# Patient Record
Sex: Male | Born: 1965 | Race: Black or African American | Hispanic: No | Marital: Single | Smoking: Never smoker
Health system: Southern US, Community
[De-identification: ages and names within clinical notes are randomized; demographics above are authoritative.]

## PROBLEM LIST (undated history)

## (undated) DIAGNOSIS — M1990 Unspecified osteoarthritis, unspecified site: Secondary | ICD-10-CM

## (undated) DIAGNOSIS — E785 Hyperlipidemia, unspecified: Secondary | ICD-10-CM

## (undated) DIAGNOSIS — W3400XA Accidental discharge from unspecified firearms or gun, initial encounter: Secondary | ICD-10-CM

## (undated) DIAGNOSIS — I10 Essential (primary) hypertension: Secondary | ICD-10-CM

## (undated) HISTORY — DX: Accidental discharge from unspecified firearms or gun, initial encounter: W34.00XA

## (undated) HISTORY — DX: Unspecified osteoarthritis, unspecified site: M19.90

## (undated) HISTORY — DX: Hyperlipidemia, unspecified: E78.5

---

## 1998-03-31 ENCOUNTER — Emergency Department (HOSPITAL_COMMUNITY): Admission: EM | Admit: 1998-03-31 | Discharge: 1998-03-31 | Payer: Self-pay | Admitting: Emergency Medicine

## 2001-04-16 ENCOUNTER — Emergency Department (HOSPITAL_COMMUNITY): Admission: EM | Admit: 2001-04-16 | Discharge: 2001-04-16 | Payer: Self-pay | Admitting: Emergency Medicine

## 2002-02-14 ENCOUNTER — Emergency Department (HOSPITAL_COMMUNITY): Admission: AC | Admit: 2002-02-14 | Discharge: 2002-02-14 | Payer: Self-pay

## 2002-02-14 ENCOUNTER — Encounter: Payer: Self-pay | Admitting: Emergency Medicine

## 2004-05-07 ENCOUNTER — Emergency Department: Payer: Self-pay | Admitting: Unknown Physician Specialty

## 2006-09-23 ENCOUNTER — Emergency Department (HOSPITAL_COMMUNITY): Admission: EM | Admit: 2006-09-23 | Discharge: 2006-09-23 | Payer: Self-pay | Admitting: *Deleted

## 2006-10-15 ENCOUNTER — Ambulatory Visit: Payer: Self-pay | Admitting: Gastroenterology

## 2007-04-23 ENCOUNTER — Emergency Department (HOSPITAL_COMMUNITY): Admission: EM | Admit: 2007-04-23 | Discharge: 2007-04-24 | Payer: Self-pay | Admitting: Emergency Medicine

## 2010-07-31 DIAGNOSIS — W3400XA Accidental discharge from unspecified firearms or gun, initial encounter: Secondary | ICD-10-CM

## 2010-07-31 HISTORY — DX: Accidental discharge from unspecified firearms or gun, initial encounter: W34.00XA

## 2010-07-31 HISTORY — PX: KNEE SURGERY: SHX244

## 2010-08-07 ENCOUNTER — Inpatient Hospital Stay (HOSPITAL_COMMUNITY)
Admission: EM | Admit: 2010-08-07 | Discharge: 2010-08-17 | Payer: Self-pay | Source: Home / Self Care | Attending: Orthopedic Surgery | Admitting: Orthopedic Surgery

## 2010-08-15 LAB — CBC
HCT: 38.8 % — ABNORMAL LOW (ref 39.0–52.0)
HCT: 36.5 % — ABNORMAL LOW (ref 39.0–52.0)
Hemoglobin: 13.3 g/dL (ref 13.0–17.0)
Hemoglobin: 12 g/dL — ABNORMAL LOW (ref 13.0–17.0)
MCH: 32.6 pg (ref 26.0–34.0)
MCH: 32.2 pg (ref 26.0–34.0)
MCHC: 34.3 g/dL (ref 30.0–36.0)
MCHC: 32.9 g/dL (ref 30.0–36.0)
MCV: 95.1 fL (ref 78.0–100.0)
MCV: 97.9 fL (ref 78.0–100.0)
Platelets: 341 10*3/uL (ref 150–400)
Platelets: 466 10*3/uL — ABNORMAL HIGH (ref 150–400)
RBC: 4.08 MIL/uL — ABNORMAL LOW (ref 4.22–5.81)
RBC: 3.73 MIL/uL — ABNORMAL LOW (ref 4.22–5.81)
RDW: 12.6 % (ref 11.5–15.5)
RDW: 12.6 % (ref 11.5–15.5)
WBC: 10.2 10*3/uL (ref 4.0–10.5)
WBC: 10.1 10*3/uL (ref 4.0–10.5)

## 2010-08-15 LAB — PROTIME-INR
INR: 0.98 (ref 0.00–1.49)
INR: 1.15 (ref 0.00–1.49)
INR: 1.27 (ref 0.00–1.49)
INR: 1.66 — ABNORMAL HIGH (ref 0.00–1.49)
INR: 1.87 — ABNORMAL HIGH (ref 0.00–1.49)
INR: 1.93 — ABNORMAL HIGH (ref 0.00–1.49)
INR: 2.23 — ABNORMAL HIGH (ref 0.00–1.49)
Prothrombin Time: 13.2 seconds (ref 11.6–15.2)
Prothrombin Time: 14.9 seconds (ref 11.6–15.2)
Prothrombin Time: 16.1 seconds — ABNORMAL HIGH (ref 11.6–15.2)
Prothrombin Time: 19.8 seconds — ABNORMAL HIGH (ref 11.6–15.2)
Prothrombin Time: 21.7 seconds — ABNORMAL HIGH (ref 11.6–15.2)
Prothrombin Time: 22.2 seconds — ABNORMAL HIGH (ref 11.6–15.2)
Prothrombin Time: 24.8 seconds — ABNORMAL HIGH (ref 11.6–15.2)

## 2010-08-15 LAB — COMPREHENSIVE METABOLIC PANEL
ALT: 35 U/L (ref 0–53)
AST: 44 U/L — ABNORMAL HIGH (ref 0–37)
Albumin: 3.6 g/dL (ref 3.5–5.2)
Alkaline Phosphatase: 55 U/L (ref 39–117)
BUN: 6 mg/dL (ref 6–23)
CO2: 24 mEq/L (ref 19–32)
Calcium: 8.6 mg/dL (ref 8.4–10.5)
Chloride: 106 mEq/L (ref 96–112)
Creatinine, Ser: 1.06 mg/dL (ref 0.4–1.5)
GFR calc Af Amer: >60 mL/min (ref >60)
GFR calc non Af Amer: >60 mL/min (ref >60)
Glucose, Bld: 94 mg/dL (ref 70–99)
Potassium: 4 mEq/L (ref 3.5–5.1)
Sodium: 138 mEq/L (ref 135–145)
Total Bilirubin: 0.9 mg/dL (ref 0.3–1.2)
Total Protein: 6.8 g/dL (ref 6.0–8.3)

## 2010-08-15 LAB — DIFFERENTIAL
Basophils Absolute: 0 10*3/uL (ref 0.0–0.1)
Basophils Relative: 0 % (ref 0–1)
Eosinophils Absolute: 0 10*3/uL (ref 0.0–0.7)
Eosinophils Relative: 0 % (ref 0–5)
Lymphocytes Relative: 18 % (ref 12–46)
Lymphs Abs: 1.9 10*3/uL (ref 0.7–4.0)
Monocytes Absolute: 1.1 10*3/uL — ABNORMAL HIGH (ref 0.1–1.0)
Monocytes Relative: 11 % (ref 3–12)
Neutro Abs: 7.2 10*3/uL (ref 1.7–7.7)
Neutrophils Relative %: 71 % (ref 43–77)

## 2010-08-17 LAB — PROTIME-INR
INR: 2.33 — ABNORMAL HIGH (ref 0.00–1.49)
INR: 2.44 — ABNORMAL HIGH (ref 0.00–1.49)
Prothrombin Time: 25.7 seconds — ABNORMAL HIGH (ref 11.6–15.2)
Prothrombin Time: 26.6 seconds — ABNORMAL HIGH (ref 11.6–15.2)

## 2010-08-22 LAB — PROTIME-INR
INR: 2.31 — ABNORMAL HIGH (ref 0.00–1.49)
Prothrombin Time: 25.5 seconds — ABNORMAL HIGH (ref 11.6–15.2)

## 2010-12-16 NOTE — Assessment & Plan Note (Signed)
[facility]                         GASTROENTEROLOGY OFFICE NOTE   TADARIUS, MALAND                       MRN:          [id number]  DATE:10/16/2006                            DOB:          April 13, 1966    REFERRING PHYSICIAN:  Doug Sou, M.D.   REASON FOR REFERRAL:  Rectal bleeding and rectal pain.   HISTORY OF PRESENT ILLNESS:  Gabriel Mccann is a 45 year old male who  relates a history of rectal pain and rectal bleeding that occurred about  1 year ago.  He states he was diagnosed with an anal fissure in [facility] emergency room.  He was prescribed a topical cream and his  symptoms completely resolved, until about 3 months ago when he had  problems with recurrent rectal bleeding, sometimes on the tissue paper  and sometimes in the stool, associated with painful bowel movements.  He  was seen in [facility] emergency room on September 23, 2006 and  the findings of that evaluation are enclosed.  Rectal examination showed  a tender anal canal, external hemorrhoids, and normal-colored Hemoccult-  positive stool.  A basic metabolic panel and CBC were normal.  He was  prescribed Colace and Vicodin.  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  He has had less bleeding over the  past few weeks, but still has pain with almost every bowel movement, and  then the pain abates.  He notes he has had improvement using a stool  softener.  He notes no change in the stool caliber, weight loss,  abdominal pain, nausea, vomiting, or diarrhea.  There is no family  history of colon cancer, colon polyps, or inflammatory bowel disease.   PAST MEDICAL HISTORY:  As in the history of present illness.   PAST SURGICAL HISTORY:  Negative.   CURRENT MEDICATIONS:  1. Vicodin 5/500 q.6h p.r.n.  2. Colace 100 mg daily.   MEDICATION ALLERGIES:  None known.   SOCIAL HISTORY AND REVIEW OF SYSTEMS:  Per the hand-written  form.   PHYSICAL EXAM:  Well-developed, well-nourished in no acute distress.  Height 5 feet 7 inches, weight 169 pounds, blood pressure 122/70, pulse  92 and regular.  HEENT:  Anicteric sclerae.  Oropharynx clear.  CHEST:  Clear to auscultation bilaterally.  CARDIAC:  Regular rate and rhythm without murmurs.  ABDOMEN:  Soft and nontender.  Nondistended.  Normoactive bowel sounds.  No palpable organomegaly, masses, or hernias.  RECTAL:  Examination reveals small external hemorrhoidal tags.  No other  lesions.  No fissures noted.  Digital examination reveals a very tender  anal canal, which limited the examination.  I was not able to obtain a  stool specimen for Hemoccult testing.  EXTREMITIES:  Without cyanosis, clubbing, or edema.  NEUROLOGIC:  Alert and oriented x3.  Grossly nonfocal.   ASSESSMENT AND PLAN:  Anal pain with rectal bleeding, hemocult positive  stool and small external hemorrhoids.  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  Begin diltiazem 2% cream b.i.d. for  the next 6 to 8 weeks.  Continue Colace on a daily basis and maintain  adequate hydration with a high-fiber diet.  Plan for return office visit  in 6 to 8 weeks for further evaluation to include a colonoscopy to rule  out inflammatory bowel disease, colorectal neoplasms, and other  disorders.     Venita Lick. Russella Dar, MD, [facility]  Electronically Signed    MTS/MedQ  DD: 10/15/2006  DT: 10/16/2006  Job #: [id number]   cc:   Doug Sou, M.D.

## 2011-05-11 LAB — DIFFERENTIAL
Basophils Absolute: 0
Basophils Relative: 1
Eosinophils Absolute: 0.1
Eosinophils Relative: 1
Lymphocytes Relative: 23
Lymphs Abs: 1.6
Monocytes Absolute: 0.6
Monocytes Relative: 9
Neutro Abs: 4.4
Neutrophils Relative %: 66

## 2011-05-11 LAB — CBC
HCT: 37.9 — ABNORMAL LOW
Hemoglobin: 13.3
MCHC: 35
MCV: 94.6
Platelets: 324
RBC: 4.01 — ABNORMAL LOW
RDW: 12.5
WBC: 6.7

## 2011-05-11 LAB — COMPREHENSIVE METABOLIC PANEL
ALT: 21
AST: 40 — ABNORMAL HIGH
Albumin: 3.6
Alkaline Phosphatase: 49
BUN: 7
CO2: 28
Calcium: 9
Chloride: 105
Creatinine, Ser: 0.93
GFR calc Af Amer: >60
GFR calc non Af Amer: >60
Glucose, Bld: 103 — ABNORMAL HIGH
Potassium: 3.8
Sodium: 137
Total Bilirubin: 1.2
Total Protein: 6.2

## 2011-05-11 LAB — LIPASE, BLOOD: Lipase: 37

## 2011-05-11 LAB — OCCULT BLOOD X 1 CARD TO LAB, STOOL: Fecal Occult Bld: NEGATIVE

## 2014-10-02 ENCOUNTER — Encounter: Payer: Self-pay | Admitting: Family Medicine

## 2014-10-02 ENCOUNTER — Ambulatory Visit: Payer: Medicaid Other | Attending: Family Medicine | Admitting: Family Medicine

## 2014-10-02 VITALS — BP 115/78 | HR 90 | Temp 98.6°F | Resp 16 | Ht 67.0 in | Wt 171.0 lb

## 2014-10-02 DIAGNOSIS — G8929 Other chronic pain: Secondary | ICD-10-CM | POA: Diagnosis not present

## 2014-10-02 DIAGNOSIS — M25562 Pain in left knee: Secondary | ICD-10-CM | POA: Diagnosis not present

## 2014-10-02 DIAGNOSIS — M25561 Pain in right knee: Secondary | ICD-10-CM | POA: Diagnosis not present
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
## 2014-10-02 NOTE — Assessment & Plan Note (Signed)
1. Chronic bilateral knee pain: Referral to pain management placed You will be contacted with appt details.

## 2014-10-02 NOTE — Patient Instructions (Signed)
Gabriel Mccann,  Thank you for coming in today. It was a pleasure meeting you. I look forward to being your primary doctor.   1. Chronic bilateral knee pain: Referral to pain management placed You will be contacted with appt details.   Please apply for [place] discount and orange card, you can also inquire if any of your medications are on the PASS (medications assistance) list.   F/u in 6-8 weeks for full wellness physical  Dr. Adrian Blackwater
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
## 2016-08-04 ENCOUNTER — Encounter (HOSPITAL_COMMUNITY): Payer: Self-pay | Admitting: *Deleted

## 2016-08-04 ENCOUNTER — Emergency Department (HOSPITAL_COMMUNITY)
Admission: EM | Admit: 2016-08-04 | Discharge: 2016-08-04 | Disposition: A | Discharge status: Home / Self Care | Payer: Medicare Other | Attending: Emergency Medicine | Admitting: Emergency Medicine

## 2016-08-04 ENCOUNTER — Emergency Department (HOSPITAL_COMMUNITY): Payer: Medicare Other

## 2016-08-04 DIAGNOSIS — J351 Hypertrophy of tonsils: Secondary | ICD-10-CM | POA: Diagnosis not present

## 2016-08-04 DIAGNOSIS — J020 Streptococcal pharyngitis: Secondary | ICD-10-CM | POA: Insufficient documentation

## 2016-08-04 DIAGNOSIS — J039 Acute tonsillitis, unspecified: Secondary | ICD-10-CM

## 2016-08-04 DIAGNOSIS — J36 Peritonsillar abscess: Secondary | ICD-10-CM | POA: Diagnosis not present

## 2016-08-04 DIAGNOSIS — J0300 Acute streptococcal tonsillitis, unspecified: Secondary | ICD-10-CM | POA: Diagnosis not present

## 2016-08-04 DIAGNOSIS — J029 Acute pharyngitis, unspecified: Secondary | ICD-10-CM | POA: Diagnosis not present

## 2016-08-04 LAB — CBC WITH DIFFERENTIAL/PLATELET
BASOS PCT: 0 %
Basophils Absolute: 0 10*3/uL (ref 0.0–0.1)
EOS ABS: 0 10*3/uL (ref 0.0–0.7)
Eosinophils Relative: 0 %
HCT: 39.5 % (ref 39.0–52.0)
Hemoglobin: 13.1 g/dL (ref 13.0–17.0)
LYMPHS ABS: 2 10*3/uL (ref 0.7–4.0)
Lymphocytes Relative: 10 %
MCH: 32.3 pg (ref 26.0–34.0)
MCHC: 33.2 g/dL (ref 30.0–36.0)
MCV: 97.3 fL (ref 78.0–100.0)
Monocytes Absolute: 1.4 10*3/uL — ABNORMAL HIGH (ref 0.1–1.0)
Monocytes Relative: 7 %
Neutro Abs: 17 10*3/uL — ABNORMAL HIGH (ref 1.7–7.7)
Neutrophils Relative %: 83 %
PLATELETS: 343 10*3/uL (ref 150–400)
RBC: 4.06 MIL/uL — ABNORMAL LOW (ref 4.22–5.81)
RDW: 12.4 % (ref 11.5–15.5)
WBC: 20.4 10*3/uL — ABNORMAL HIGH (ref 4.0–10.5)

## 2016-08-04 LAB — BASIC METABOLIC PANEL
Anion gap: 11 (ref 5–15)
BUN: 7 mg/dL (ref 6–20)
CALCIUM: 9.6 mg/dL (ref 8.9–10.3)
CO2: 27 mmol/L (ref 22–32)
CREATININE: 1.03 mg/dL (ref 0.61–1.24)
Chloride: 103 mmol/L (ref 101–111)
GFR calc non Af Amer: >60 mL/min (ref >60)
GLUCOSE: 112 mg/dL — AB (ref 65–99)
Potassium: 4 mmol/L (ref 3.5–5.1)
Sodium: 141 mmol/L (ref 135–145)

## 2016-08-04 LAB — RAPID STREP SCREEN (MED CTR MEBANE ONLY): Streptococcus, Group A Screen (Direct): POSITIVE — AB

## 2016-08-04 MED ORDER — PENICILLIN G BENZATHINE 1200000 UNIT/2ML IM SUSP
1.2000 10*6.[IU] | Freq: Once | INTRAMUSCULAR | Status: AC
  Administered 2016-08-04: 1.2 10*6.[IU] via INTRAMUSCULAR
  Filled 2016-08-04: qty 2

## 2016-08-04 MED ORDER — IOPAMIDOL (ISOVUE-300) INJECTION 61%
INTRAVENOUS | Status: AC
  Filled 2016-08-04: qty 75

## 2016-08-04 MED ORDER — SODIUM CHLORIDE 0.9 % IV BOLUS (SEPSIS)
1000.0000 mL | Freq: Once | INTRAVENOUS | Status: AC
  Administered 2016-08-04: 1000 mL via INTRAVENOUS

## 2016-08-04 MED ORDER — ONDANSETRON HCL 4 MG/2ML IJ SOLN
4.0000 mg | Freq: Once | INTRAMUSCULAR | Status: AC
  Administered 2016-08-04: 4 mg via INTRAVENOUS
  Filled 2016-08-04: qty 2

## 2016-08-04 MED ORDER — DEXAMETHASONE SODIUM PHOSPHATE 10 MG/ML IJ SOLN
10.0000 mg | Freq: Once | INTRAMUSCULAR | Status: AC
  Administered 2016-08-04: 10 mg via INTRAMUSCULAR
  Filled 2016-08-04: qty 1

## 2016-08-04 MED ORDER — MORPHINE SULFATE (PF) 4 MG/ML IV SOLN
4.0000 mg | Freq: Once | INTRAVENOUS | Status: AC
  Administered 2016-08-04: 4 mg via INTRAVENOUS
  Filled 2016-08-04: qty 1

## 2016-08-04 MED ORDER — HYDROCODONE-ACETAMINOPHEN 7.5-325 MG/15ML PO SOLN: ORAL | 0 refills | Status: DC

## 2016-08-04 MED ORDER — IBUPROFEN 100 MG/5ML PO SUSP: 400.0000 mg | Freq: Four times a day (QID) | ORAL | 1 refills | Status: DC | PRN

## 2016-08-04 MED ORDER — MORPHINE SULFATE (PF) 4 MG/ML IV SOLN: 4.0000 mg | Freq: Once | INTRAVENOUS | Status: DC

## 2016-08-04 MED ORDER — SODIUM CHLORIDE 0.9 % IV SOLN
3.0000 g | Freq: Once | INTRAVENOUS | Status: AC
  Administered 2016-08-04: 3 g via INTRAVENOUS
  Filled 2016-08-04: qty 3

## 2016-08-04 NOTE — ED Notes (Signed)
Pt ambulated to and from restroom; tolerated well 
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
## 2016-08-04 NOTE — ED Notes (Signed)
ED Provider at bedside. 

## 2016-08-04 NOTE — Discharge Instructions (Addendum)
Return to the Emergency Department if your unable to swallow liquids or you develop difficulty breathing or symptoms are worsening. You should see significant improvement within the next 24-48 hours.

## 2016-08-04 NOTE — ED Notes (Signed)
Pt requesting something to drink, this RN spoke with PA Dansie who advised patient needs to remain NPO at this time

## 2016-08-04 NOTE — ED Notes (Signed)
Pt read for discharge. Pt only tolerating a small amount of PO liquids and spitting the rest out. Secretions controlled. MD aware and feels pt is safe for discharge. Pt given d/c instructions.

## 2016-08-04 NOTE — ED Notes (Signed)
Gave pt a cup of water to drink.

## 2016-08-04 NOTE — ED Notes (Signed)
PA Dansie made aware that patient is requesting something else for pain at this time and that patient is still tachycardic

## 2016-08-04 NOTE — ED Triage Notes (Signed)
C/o sore throat onset 2 days ago. States he isn't able to swallow.

## 2016-12-13 ENCOUNTER — Encounter: Payer: Self-pay | Admitting: Family Medicine

## 2017-05-30 ENCOUNTER — Encounter (INDEPENDENT_AMBULATORY_CARE_PROVIDER_SITE_OTHER): Payer: Self-pay | Admitting: Physician Assistant

## 2017-05-30 ENCOUNTER — Ambulatory Visit (INDEPENDENT_AMBULATORY_CARE_PROVIDER_SITE_OTHER): Payer: Medicare Other | Admitting: Physician Assistant

## 2017-05-30 VITALS — BP 138/76 | HR 87 | Temp 98.3°F | Wt 186.4 lb

## 2017-05-30 DIAGNOSIS — M25562 Pain in left knee: Secondary | ICD-10-CM | POA: Diagnosis not present

## 2017-05-30 DIAGNOSIS — G4701 Insomnia due to medical condition: Secondary | ICD-10-CM | POA: Diagnosis not present

## 2017-05-30 DIAGNOSIS — F1290 Cannabis use, unspecified, uncomplicated: Secondary | ICD-10-CM

## 2017-05-30 DIAGNOSIS — Z23 Encounter for immunization: Secondary | ICD-10-CM

## 2017-05-30 DIAGNOSIS — G8929 Other chronic pain: Secondary | ICD-10-CM

## 2017-05-30 DIAGNOSIS — M25561 Pain in right knee: Secondary | ICD-10-CM | POA: Diagnosis not present

## 2017-05-30 MED ORDER — IBUPROFEN 600 MG PO TABS: 600.0000 mg | ORAL_TABLET | Freq: Three times a day (TID) | ORAL | 0 refills | Status: DC | PRN
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
## 2017-05-30 NOTE — Patient Instructions (Signed)
Td Vaccine (Tetanus and Diphtheria): What You Need to Know 1. Why get vaccinated? Tetanus  and diphtheria are very serious diseases. They are rare in the [place] today, but people who do become infected often have severe complications. Td vaccine is used to protect adolescents and adults from both of these diseases. Both tetanus and diphtheria are infections caused by bacteria. Diphtheria spreads from person to person through coughing or sneezing. Tetanus-causing bacteria enter the body through cuts, scratches, or wounds. TETANUS (lockjaw) causes painful muscle tightening and stiffness, usually all over the body.  It can lead to tightening of muscles in the head and neck so you can't open your mouth, swallow, or sometimes even breathe. Tetanus kills about 1 out of every 10 people who are infected even after receiving the best medical care.  DIPHTHERIA can cause a thick coating to form in the back of the throat.  It can lead to breathing problems, paralysis, heart failure, and death.  Before vaccines, as many as 200,000 cases of diphtheria and hundreds of cases of tetanus were reported in the [place] each year. Since vaccination began, reports of cases for both diseases have dropped by about 99%. 2. Td vaccine Td vaccine can protect adolescents and adults from tetanus and diphtheria. Td is usually given as a booster dose every 10 years but it can also be given earlier after a severe and dirty wound or burn. Another vaccine, called Tdap, which protects against pertussis in addition to tetanus and diphtheria, is sometimes recommended instead of Td vaccine. Your doctor or the person giving you the vaccine can give you more information. Td may safely be given at the same time as other vaccines. 3. Some people should not get this vaccine  A person who has ever had a life-threatening allergic reaction after a previous dose of any tetanus or diphtheria containing vaccine, OR has a severe  allergy to any part of this vaccine, should not get Td vaccine. Tell the person giving the vaccine about any severe allergies.  Talk to your doctor if you: ? had severe pain or swelling after any vaccine containing diphtheria or tetanus, ? ever had a condition called Guillain Barre Syndrome (GBS), ? aren't feeling well on the day the shot is scheduled. 4. What are the risks from Td vaccine? With any medicine, including vaccines, there is a chance of side effects. These are usually mild and go away on their own. Serious reactions are also possible but are rare. Most people who get Td vaccine do not have any problems with it. Mild problems following Td vaccine: (Did not interfere with activities)  Pain where the shot was given (about 8 people in 10)  Redness or swelling where the shot was given (about 1 person in 4)  Mild fever (rare)  Headache (about 1 person in 4)  Tiredness (about 1 person in 4)  Moderate problems following Td vaccine: (Interfered with activities, but did not require medical attention)  Fever over 102F (rare)  Severe problems following Td vaccine: (Unable to perform usual activities; required medical attention)  Swelling, severe pain, bleeding and/or redness in the arm where the shot was given (rare).  Problems that could happen after any vaccine:  People sometimes faint after a medical procedure, including vaccination. Sitting or lying down for about 15 minutes can help prevent fainting, and injuries caused by a fall. Tell your doctor if you feel dizzy, or have vision changes or ringing in the ears.  Some people get   severe pain in the shoulder and have difficulty moving the arm where a shot was given. This happens very rarely.  Any medication can cause a severe allergic reaction. Such reactions from a vaccine are very rare, estimated at fewer than 1 in a million doses, and would happen within a few minutes to a few hours after the vaccination. As with any  medicine, there is a very remote chance of a vaccine causing a serious injury or death. The safety of vaccines is always being monitored. For more information, visit: www.cdc.gov/vaccinesafety/ 5. What if there is a serious reaction? What should I look for? Look for anything that concerns you, such as signs of a severe allergic reaction, very high fever, or unusual behavior. Signs of a severe allergic reaction can include hives, swelling of the face and throat, difficulty breathing, a fast heartbeat, dizziness, and weakness. These would usually start a few minutes to a few hours after the vaccination. What should I do?  If you think it is a severe allergic reaction or other emergency that can't wait, call 9-1-1 or get the person to the nearest hospital. Otherwise, call your doctor.  Afterward, the reaction should be reported to the Vaccine Adverse Event Reporting System (VAERS). Your doctor might file this report, or you can do it yourself through the VAERS web site at www.vaers.hhs.gov, or by calling 1-800-822-7967. ? VAERS does not give medical advice. 6. The National Vaccine Injury Compensation Program The National Vaccine Injury Compensation Program (VICP) is a federal program that was created to compensate people who may have been injured by certain vaccines. Persons who believe they may have been injured by a vaccine can learn about the program and about filing a claim by calling 1-800-338-2382 or visiting the VICP website at www.hrsa.gov/vaccinecompensation. There is a time limit to file a claim for compensation. 7. How can I learn more?  Ask your doctor. He or she can give you the vaccine package insert or suggest other sources of information.  Call your local or state health department.  Contact the Centers for Disease Control and Prevention (CDC): ? Call 1-800-232-4636 (1-800-CDC-INFO) ? Visit CDC's website at www.cdc.gov/vaccines CDC Td Vaccine VIS (11/09/15) This information is  not intended to replace advice given to you by your health care provider. Make sure you discuss any questions you have with your health care provider. Document Released: 05/14/2006 Document Revised: 04/06/2016 Document Reviewed: 04/06/2016 Elsevier Interactive Patient Education  2017 [employer]vier Inc.  

## 2017-06-27 ENCOUNTER — Ambulatory Visit (INDEPENDENT_AMBULATORY_CARE_PROVIDER_SITE_OTHER): Payer: Medicare Other | Admitting: Physician Assistant

## 2017-07-10 ENCOUNTER — Ambulatory Visit (INDEPENDENT_AMBULATORY_CARE_PROVIDER_SITE_OTHER): Payer: Medicare Other | Admitting: Physician Assistant

## 2017-07-19 ENCOUNTER — Emergency Department (HOSPITAL_COMMUNITY)
Admission: EM | Admit: 2017-07-19 | Discharge: 2017-07-19 | Disposition: A | Discharge status: Home / Self Care | Payer: Medicare Other

## 2017-07-19 ENCOUNTER — Other Ambulatory Visit: Payer: Self-pay

## 2017-07-19 NOTE — ED Notes (Signed)
Pt has Environmental manager to escort pt, pt allowing blood draw but declining to be evaluated.

## 2017-07-27 ENCOUNTER — Ambulatory Visit (INDEPENDENT_AMBULATORY_CARE_PROVIDER_SITE_OTHER): Payer: Medicare Other | Admitting: Physician Assistant

## 2017-08-13 ENCOUNTER — Encounter (INDEPENDENT_AMBULATORY_CARE_PROVIDER_SITE_OTHER): Payer: Self-pay | Admitting: Physician Assistant

## 2017-08-13 ENCOUNTER — Other Ambulatory Visit: Payer: Self-pay

## 2017-08-13 ENCOUNTER — Ambulatory Visit (INDEPENDENT_AMBULATORY_CARE_PROVIDER_SITE_OTHER): Payer: Medicare Other | Admitting: Physician Assistant

## 2017-08-13 VITALS — BP 144/86 | HR 84 | Temp 98.4°F | Ht 67.0 in | Wt 186.4 lb

## 2017-08-13 DIAGNOSIS — M25562 Pain in left knee: Secondary | ICD-10-CM | POA: Diagnosis not present

## 2017-08-13 DIAGNOSIS — Z1211 Encounter for screening for malignant neoplasm of colon: Secondary | ICD-10-CM

## 2017-08-13 DIAGNOSIS — Z114 Encounter for screening for human immunodeficiency virus [HIV]: Secondary | ICD-10-CM | POA: Diagnosis not present

## 2017-08-13 DIAGNOSIS — Z125 Encounter for screening for malignant neoplasm of prostate: Secondary | ICD-10-CM

## 2017-08-13 DIAGNOSIS — J069 Acute upper respiratory infection, unspecified: Secondary | ICD-10-CM | POA: Diagnosis not present

## 2017-08-13 DIAGNOSIS — Z0000 Encounter for general adult medical examination without abnormal findings: Secondary | ICD-10-CM | POA: Diagnosis not present

## 2017-08-13 DIAGNOSIS — M25561 Pain in right knee: Secondary | ICD-10-CM

## 2017-08-13 DIAGNOSIS — M7711 Lateral epicondylitis, right elbow: Secondary | ICD-10-CM | POA: Diagnosis not present

## 2017-08-13 DIAGNOSIS — G8929 Other chronic pain: Secondary | ICD-10-CM

## 2017-08-13 MED ORDER — DM-APAP-CPM 15-500-2 MG PO TABS: 2.0000 | ORAL_TABLET | Freq: Four times a day (QID) | ORAL | 0 refills | Status: AC
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
## 2017-08-13 NOTE — Patient Instructions (Signed)
Upper Respiratory Infection, Adult Most upper respiratory infections (URIs) are caused by a virus. A URI affects the nose, throat, and upper air passages. The most common type of URI is often called "the common cold." Follow these instructions at home:  Take medicines only as told by your doctor.  Gargle warm saltwater or take cough drops to comfort your throat as told by your doctor.  Use a warm mist humidifier or inhale steam from a shower to increase air moisture. This may make it easier to breathe.  Drink enough fluid to keep your pee (urine) clear or pale yellow.  Eat soups and other clear broths.  Have a healthy diet.  Rest as needed.  Go back to work when your fever is gone or your doctor says it is okay. ? You may need to stay home longer to avoid giving your URI to others. ? You can also wear a face mask and wash your hands often to prevent spread of the virus.  Use your inhaler more if you have asthma.  Do not use any tobacco products, including cigarettes, chewing tobacco, or electronic cigarettes. If you need help quitting, ask your doctor. Contact a doctor if:  You are getting worse, not better.  Your symptoms are not helped by medicine.  You have chills.  You are getting more short of breath.  You have brown or red mucus.  You have yellow or brown discharge from your nose.  You have pain in your face, especially when you bend forward.  You have a fever.  You have puffy (swollen) neck glands.  You have pain while swallowing.  You have white areas in the back of your throat. Get help right away if:  You have very bad or constant: ? Headache. ? Ear pain. ? Pain in your forehead, behind your eyes, and over your cheekbones (sinus pain). ? Chest pain.  You have long-lasting (chronic) lung disease and any of the following: ? Wheezing. ? Long-lasting cough. ? Coughing up blood. ? A change in your usual mucus.  You have a stiff neck.  You have  changes in your: ? Vision. ? Hearing. ? Thinking. ? Mood. This information is not intended to replace advice given to you by your health care provider. Make sure you discuss any questions you have with your health care provider. Document Released: 01/03/2008 Document Revised: 03/19/2016 Document Reviewed: 10/22/2013 Elsevier Interactive Patient Education  2018 Elsevier Inc.  
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
## 2017-08-14 ENCOUNTER — Other Ambulatory Visit (INDEPENDENT_AMBULATORY_CARE_PROVIDER_SITE_OTHER): Payer: Self-pay | Admitting: Physician Assistant

## 2017-08-14 DIAGNOSIS — E781 Pure hyperglyceridemia: Secondary | ICD-10-CM

## 2017-08-14 LAB — COMPREHENSIVE METABOLIC PANEL
ALK PHOS: 79 IU/L (ref 39–117)
ALT: 14 IU/L (ref 0–44)
AST: 19 IU/L (ref 0–40)
Albumin/Globulin Ratio: 2 (ref 1.2–2.2)
Albumin: 4.3 g/dL (ref 3.5–5.5)
BILIRUBIN TOTAL: 0.3 mg/dL (ref 0.0–1.2)
BUN/Creatinine Ratio: 8 — ABNORMAL LOW (ref 9–20)
BUN: 8 mg/dL (ref 6–24)
CHLORIDE: 102 mmol/L (ref 96–106)
CO2: 25 mmol/L (ref 20–29)
CREATININE: 1 mg/dL (ref 0.76–1.27)
Calcium: 9.3 mg/dL (ref 8.7–10.2)
GFR calc Af Amer: 100 mL/min/{1.73_m2} (ref >59)
GFR calc non Af Amer: 87 mL/min/{1.73_m2} (ref >59)
GLUCOSE: 99 mg/dL (ref 65–99)
Globulin, Total: 2.1 g/dL (ref 1.5–4.5)
Potassium: 4.7 mmol/L (ref 3.5–5.2)
Sodium: 139 mmol/L (ref 134–144)
Total Protein: 6.4 g/dL (ref 6.0–8.5)

## 2017-08-14 LAB — LIPID PANEL
CHOLESTEROL TOTAL: 187 mg/dL (ref 100–199)
Chol/HDL Ratio: 3.7 ratio (ref 0.0–5.0)
HDL: 50 mg/dL (ref >39)
LDL Calculated: 85 mg/dL (ref 0–99)
TRIGLYCERIDES: 259 mg/dL — AB (ref 0–149)
VLDL CHOLESTEROL CAL: 52 mg/dL — AB (ref 5–40)

## 2017-08-14 LAB — HIV ANTIBODY (ROUTINE TESTING W REFLEX): HIV SCREEN 4TH GENERATION: NONREACTIVE

## 2017-08-14 LAB — PSA: Prostate Specific Ag, Serum: 0.5 ng/mL (ref 0.0–4.0)

## 2017-08-14 MED ORDER — FENOFIBRATE 145 MG PO TABS: 145.0000 mg | ORAL_TABLET | Freq: Every day | ORAL | 1 refills | Status: DC

## 2017-08-15 ENCOUNTER — Telehealth (INDEPENDENT_AMBULATORY_CARE_PROVIDER_SITE_OTHER): Payer: Self-pay

## 2017-08-15 ENCOUNTER — Encounter (INDEPENDENT_AMBULATORY_CARE_PROVIDER_SITE_OTHER): Payer: Self-pay

## 2017-08-15 LAB — FECAL OCCULT BLOOD, IMMUNOCHEMICAL: FECAL OCCULT BLD: NEGATIVE

## 2017-08-15 NOTE — Telephone Encounter (Signed)
-----   Message from Clent Demark, PA-C sent at 08/14/2017  1:11 PM EST ----- HIV neg. Prostate normal.  Elevated triglycerides. Please advise to abstain from sweets and to exercise regularly. I have sent fenofibrate to [facility] at [facility].

## 2017-08-15 NOTE — Telephone Encounter (Signed)
Phone rings a few times, sounds like its picked up and then just disconnects. Will mail results to address on file for patient. Nat Christen, CMA

## 2017-08-20 ENCOUNTER — Telehealth: Payer: Self-pay

## 2017-08-20 NOTE — Telephone Encounter (Signed)
-----   Message from Clent Demark, PA-C sent at 08/15/2017  6:29 PM EST ----- Negative FIT.

## 2017-08-20 NOTE — Telephone Encounter (Signed)
Unable to reach patient. Results mailed. Nat Christen, CMA

## 2017-09-03 ENCOUNTER — Encounter: Payer: Self-pay | Admitting: Physical Therapy

## 2017-09-03 ENCOUNTER — Ambulatory Visit: Payer: Medicare Other | Attending: Physician Assistant | Admitting: Physical Therapy

## 2017-09-03 DIAGNOSIS — M6281 Muscle weakness (generalized): Secondary | ICD-10-CM

## 2017-09-03 DIAGNOSIS — M25621 Stiffness of right elbow, not elsewhere classified: Secondary | ICD-10-CM | POA: Diagnosis not present

## 2017-09-03 DIAGNOSIS — M25521 Pain in right elbow: Secondary | ICD-10-CM | POA: Diagnosis not present
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
## 2017-09-04 ENCOUNTER — Telehealth (INDEPENDENT_AMBULATORY_CARE_PROVIDER_SITE_OTHER): Payer: Self-pay | Admitting: Physician Assistant

## 2017-09-04 NOTE — Telephone Encounter (Signed)
Pt called to request a new referral for fi left ankle and foot, please follow up

## 2017-09-04 NOTE — Telephone Encounter (Signed)
I left message to call us back to schedule appt since pcp need to evaluate him before can auth a referral that he request

## 2017-09-04 NOTE — Telephone Encounter (Signed)
He has only been assessed for chronic bilateral knee pain and elbow pain. Please have him come in for evaluation.

## 2017-09-11 ENCOUNTER — Ambulatory Visit: Payer: Medicare Other | Admitting: Physical Therapy

## 2017-09-17 ENCOUNTER — Encounter: Payer: Self-pay | Admitting: Physical Therapy

## 2017-09-17 ENCOUNTER — Ambulatory Visit: Payer: Medicare Other | Admitting: Physical Therapy

## 2017-09-17 DIAGNOSIS — M25521 Pain in right elbow: Secondary | ICD-10-CM | POA: Diagnosis not present

## 2017-09-17 DIAGNOSIS — M6281 Muscle weakness (generalized): Secondary | ICD-10-CM

## 2017-09-17 DIAGNOSIS — M25621 Stiffness of right elbow, not elsewhere classified: Secondary | ICD-10-CM
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
## 2017-09-24 ENCOUNTER — Ambulatory Visit: Payer: Medicare Other | Admitting: Physical Therapy

## 2017-09-24 ENCOUNTER — Encounter: Payer: Self-pay | Admitting: Physical Therapy

## 2017-09-24 DIAGNOSIS — M25621 Stiffness of right elbow, not elsewhere classified: Secondary | ICD-10-CM

## 2017-09-24 DIAGNOSIS — M6281 Muscle weakness (generalized): Secondary | ICD-10-CM

## 2017-09-24 DIAGNOSIS — M25521 Pain in right elbow: Secondary | ICD-10-CM
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
## 2017-10-01 ENCOUNTER — Ambulatory Visit: Payer: Medicare Other | Attending: Physician Assistant | Admitting: Physical Therapy

## 2018-05-03 ENCOUNTER — Encounter

## 2018-07-25 ENCOUNTER — Emergency Department (HOSPITAL_COMMUNITY): Payer: Medicare Other

## 2018-07-25 ENCOUNTER — Encounter (HOSPITAL_COMMUNITY): Payer: Self-pay | Admitting: Emergency Medicine

## 2018-07-25 ENCOUNTER — Other Ambulatory Visit: Payer: Self-pay

## 2018-07-25 ENCOUNTER — Emergency Department (HOSPITAL_COMMUNITY)
Admission: EM | Admit: 2018-07-25 | Discharge: 2018-07-25 | Disposition: A | Discharge status: Home / Self Care | Payer: Medicare Other | Attending: Emergency Medicine | Admitting: Emergency Medicine

## 2018-07-25 DIAGNOSIS — M25572 Pain in left ankle and joints of left foot: Secondary | ICD-10-CM

## 2018-07-25 MED ORDER — KETOROLAC TROMETHAMINE 30 MG/ML IJ SOLN
30.0000 mg | Freq: Once | INTRAMUSCULAR | Status: AC
  Administered 2018-07-25: 30 mg via INTRAMUSCULAR
  Filled 2018-07-25: qty 1

## 2018-07-25 MED ORDER — NAPROXEN 500 MG PO TABS: 500.0000 mg | ORAL_TABLET | Freq: Two times a day (BID) | ORAL | 0 refills | Status: DC

## 2018-07-25 MED ORDER — ACETAMINOPHEN 500 MG PO TABS: 500.0000 mg | ORAL_TABLET | Freq: Four times a day (QID) | ORAL | 0 refills | Status: DC | PRN

## 2018-07-25 NOTE — ED Notes (Signed)
Patient transported to X-ray 
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
## 2018-07-25 NOTE — Discharge Instructions (Addendum)
Medications: Naprosyn, Tylenol  Treatment: Take Naprosyn every 12 hours to help with pain and swelling. You can alternate with Tylenol for breakthrough pain every 6 hours as needed. Use ice 3-4 times daily alternating 20 minutes on, 20 minutes off. Keep your leg elevated whenever you're not walking on it. Wear your splint at all times except when bathing. Use crutches at all times initially and begin partial weightbearing as tolerated.  Follow-up: Please follow-up with your doctor or the orthopedic doctor if your symptoms are not improving over the next 7-10 days. Please return to the emergency department if you develop any new or worsening symptoms.

## 2018-07-25 NOTE — ED Notes (Signed)
Pt stable, ambulatory, and verbalizes understanding of d/c instructions. Pt unable to sign, Hallway pt, No signature pad available.

## 2018-07-25 NOTE — ED Triage Notes (Signed)
Pt stated he tripped and fell down 15 steps on Sunday 22nd and is no having right knee pain and left ankle pain.

## 2018-09-23 DIAGNOSIS — J9621 Acute and chronic respiratory failure with hypoxia: Secondary | ICD-10-CM

## 2019-04-29 ENCOUNTER — Ambulatory Visit (INDEPENDENT_AMBULATORY_CARE_PROVIDER_SITE_OTHER): Payer: Medicare Other | Admitting: Primary Care

## 2019-05-14 ENCOUNTER — Ambulatory Visit (INDEPENDENT_AMBULATORY_CARE_PROVIDER_SITE_OTHER): Payer: Medicare Other | Admitting: Primary Care

## 2019-07-09 ENCOUNTER — Ambulatory Visit (INDEPENDENT_AMBULATORY_CARE_PROVIDER_SITE_OTHER): Payer: Medicare Other | Admitting: Primary Care

## 2019-07-10 ENCOUNTER — Telehealth (INDEPENDENT_AMBULATORY_CARE_PROVIDER_SITE_OTHER): Payer: Self-pay

## 2019-07-10 NOTE — Telephone Encounter (Signed)
Patient called to reschedule appointment and to also ask about his two knee braces and back brace. States he spoke to the provider on a three way call with the company who is going to supply patient with equipment. patient states provider gave the company the facility address and was advice that they were going to ship it to the clinic. Patient would like a call back from PCP with an update.   Please advice [phone]   Thank you Whitney Post

## 2019-07-10 NOTE — Telephone Encounter (Signed)
Patient has not been seen since 2019. Not sure who he spoke with. Patient states to just disregard. Nat Christen, CMA

## 2019-07-22 ENCOUNTER — Encounter (INDEPENDENT_AMBULATORY_CARE_PROVIDER_SITE_OTHER): Payer: Self-pay | Admitting: Primary Care

## 2019-07-22 ENCOUNTER — Ambulatory Visit (INDEPENDENT_AMBULATORY_CARE_PROVIDER_SITE_OTHER): Payer: Medicare Other | Admitting: Primary Care

## 2019-07-22 ENCOUNTER — Other Ambulatory Visit: Payer: Self-pay

## 2019-07-22 VITALS — BP 157/90 | HR 79 | Temp 97.3°F | Ht 67.0 in | Wt 169.2 lb

## 2019-07-22 DIAGNOSIS — Z23 Encounter for immunization: Secondary | ICD-10-CM

## 2019-07-22 DIAGNOSIS — G894 Chronic pain syndrome: Secondary | ICD-10-CM

## 2019-07-22 DIAGNOSIS — Z1211 Encounter for screening for malignant neoplasm of colon: Secondary | ICD-10-CM

## 2019-07-22 DIAGNOSIS — Z760 Encounter for issue of repeat prescription: Secondary | ICD-10-CM

## 2019-07-22 DIAGNOSIS — I10 Essential (primary) hypertension: Secondary | ICD-10-CM

## 2019-07-22 MED ORDER — AMLODIPINE BESYLATE 10 MG PO TABS: 10.0000 mg | ORAL_TABLET | Freq: Every day | ORAL | 3 refills | Status: DC

## 2019-07-22 MED ORDER — HYDROCHLOROTHIAZIDE 25 MG PO TABS: 25.0000 mg | ORAL_TABLET | Freq: Every day | ORAL | 3 refills | Status: DC

## 2019-07-22 MED ORDER — BLOOD PRESSURE MONITOR KIT: 1.0000 | PACK | Freq: Three times a day (TID) | 0 refills | Status: DC
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
## 2019-07-22 NOTE — Patient Instructions (Signed)
Managing Your Hypertension Hypertension is commonly called high blood pressure. This is when the force of your blood pressing against the walls of your arteries is too strong. Arteries are blood vessels that carry blood from your heart throughout your body. Hypertension forces the heart to work harder to pump blood, and may cause the arteries to become narrow or stiff. Having untreated or uncontrolled hypertension can cause heart attack, stroke, kidney disease, and other problems. What are blood pressure readings? A blood pressure reading consists of a higher number over a lower number. Ideally, your blood pressure should be below 120/80. The first ("top") number is called the systolic pressure. It is a measure of the pressure in your arteries as your heart beats. The second ("bottom") number is called the diastolic pressure. It is a measure of the pressure in your arteries as the heart relaxes. What does my blood pressure reading mean? Blood pressure is classified into four stages. Based on your blood pressure reading, your health care provider may use the following stages to determine what type of treatment you need, if any. Systolic pressure and diastolic pressure are measured in a unit called mm Hg. Normal  Systolic pressure: below 120.  Diastolic pressure: below 80. Elevated  Systolic pressure: 120-129.  Diastolic pressure: below 80. Hypertension stage 1  Systolic pressure: 130-139.  Diastolic pressure: 80-89. Hypertension stage 2  Systolic pressure: 140 or above.  Diastolic pressure: 90 or above. What health risks are associated with hypertension? Managing your hypertension is an important responsibility. Uncontrolled hypertension can lead to:  A heart attack.  A stroke.  A weakened blood vessel (aneurysm).  Heart failure.  Kidney damage.  Eye damage.  Metabolic syndrome.  Memory and concentration problems. What changes can I make to manage my  hypertension? Hypertension can be managed by making lifestyle changes and possibly by taking medicines. Your health care provider will help you make a plan to bring your blood pressure within a normal range. Eating and drinking   Eat a diet that is high in fiber and potassium, and low in salt (sodium), added sugar, and fat. An example eating plan is called the DASH (Dietary Approaches to Stop Hypertension) diet. To eat this way: ? Eat plenty of fresh fruits and vegetables. Try to fill half of your plate at each meal with fruits and vegetables. ? Eat whole grains, such as whole wheat pasta, brown rice, or whole grain bread. Fill about one quarter of your plate with whole grains. ? Eat low-fat diary products. ? Avoid fatty cuts of meat, processed or cured meats, and poultry with skin. Fill about one quarter of your plate with lean proteins such as fish, chicken without skin, beans, eggs, and tofu. ? Avoid premade and processed foods. These tend to be higher in sodium, added sugar, and fat.  Reduce your daily sodium intake. Most people with hypertension should eat less than 1,500 mg of sodium a day.  Limit alcohol intake to no more than 1 drink a day for nonpregnant women and 2 drinks a day for men. One drink equals 12 oz of beer, 5 oz of wine, or 1 oz of hard liquor. Lifestyle  Work with your health care provider to maintain a healthy body weight, or to lose weight. Ask what an ideal weight is for you.  Get at least 30 minutes of exercise that causes your heart to beat faster (aerobic exercise) most days of the week. Activities may include walking, swimming, or biking.  Include exercise   to strengthen your muscles (resistance exercise), such as weight lifting, as part of your weekly exercise routine. Try to do these types of exercises for 30 minutes at least 3 days a week.  Do not use any products that contain nicotine or tobacco, such as cigarettes and e-cigarettes. If you need help quitting,  ask your health care provider.  Control any long-term (chronic) conditions you have, such as high cholesterol or diabetes. Monitoring  Monitor your blood pressure at home as told by your health care provider. Your personal target blood pressure may vary depending on your medical conditions, your age, and other factors.  Have your blood pressure checked regularly, as often as told by your health care provider. Working with your health care provider  Review all the medicines you take with your health care provider because there may be side effects or interactions.  Talk with your health care provider about your diet, exercise habits, and other lifestyle factors that may be contributing to hypertension.  Visit your health care provider regularly. Your health care provider can help you create and adjust your plan for managing hypertension. Will I need medicine to control my blood pressure? Your health care provider may prescribe medicine if lifestyle changes are not enough to get your blood pressure under control, and if:  Your systolic blood pressure is 130 or higher.  Your diastolic blood pressure is 80 or higher. Take medicines only as told by your health care provider. Follow the directions carefully. Blood pressure medicines must be taken as prescribed. The medicine does not work as well when you skip doses. Skipping doses also puts you at risk for problems. Contact a health care provider if:  You think you are having a reaction to medicines you have taken.  You have repeated (recurrent) headaches.  You feel dizzy.  You have swelling in your ankles.  You have trouble with your vision. Get help right away if:  You develop a severe headache or confusion.  You have unusual weakness or numbness, or you feel faint.  You have severe pain in your chest or abdomen.  You vomit repeatedly.  You have trouble breathing. Summary  Hypertension is when the force of blood pumping  through your arteries is too strong. If this condition is not controlled, it may put you at risk for serious complications.  Your personal target blood pressure may vary depending on your medical conditions, your age, and other factors. For most people, a normal blood pressure is less than 120/80.  Hypertension is managed by lifestyle changes, medicines, or both. Lifestyle changes include weight loss, eating a healthy, low-sodium diet, exercising more, and limiting alcohol. This information is not intended to replace advice given to you by your health care provider. Make sure you discuss any questions you have with your health care provider. Document Released: 04/10/2012 Document Revised: 11/08/2018 Document Reviewed: 06/14/2016 [employer] Patient Education  2020 [employer]  

## 2019-07-29 ENCOUNTER — Other Ambulatory Visit (INDEPENDENT_AMBULATORY_CARE_PROVIDER_SITE_OTHER): Payer: Self-pay

## 2019-07-29 DIAGNOSIS — Z1211 Encounter for screening for malignant neoplasm of colon: Secondary | ICD-10-CM

## 2019-07-30 LAB — FECAL OCCULT BLOOD, IMMUNOCHEMICAL: Fecal Occult Bld: NEGATIVE

## 2019-08-04 ENCOUNTER — Telehealth (INDEPENDENT_AMBULATORY_CARE_PROVIDER_SITE_OTHER): Payer: Self-pay

## 2019-08-04 NOTE — Telephone Encounter (Signed)
-----   Message from [name], NP sent at 07/31/2019 10:35 PM EST -----  Test for blood in your stool was negative. At this time you do not require a colonoscopy. Will repeat test next year.

## 2019-08-04 NOTE — Telephone Encounter (Signed)
Patient verified date of birth. He is aware that FIT was negative, no need for colonoscopy. Repeat FIT in one year. Nat Christen, CMA

## 2019-09-11 ENCOUNTER — Telehealth: Payer: Self-pay | Admitting: *Deleted

## 2019-09-11 ENCOUNTER — Telehealth: Payer: Self-pay
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
## 2019-09-11 NOTE — Telephone Encounter (Signed)
Left message for patient to call office for new patient questionaire.

## 2019-09-15 ENCOUNTER — Other Ambulatory Visit: Payer: Self-pay

## 2019-09-15 ENCOUNTER — Ambulatory Visit: Payer: Medicare Other | Attending: Pain Medicine | Admitting: Pain Medicine

## 2019-09-15 DIAGNOSIS — M25561 Pain in right knee: Secondary | ICD-10-CM

## 2019-09-15 DIAGNOSIS — Z789 Other specified health status: Secondary | ICD-10-CM

## 2019-09-15 DIAGNOSIS — G8929 Other chronic pain: Secondary | ICD-10-CM

## 2019-09-15 DIAGNOSIS — Z87898 Personal history of other specified conditions: Secondary | ICD-10-CM

## 2019-09-15 DIAGNOSIS — G894 Chronic pain syndrome: Secondary | ICD-10-CM

## 2019-09-15 DIAGNOSIS — M545 Low back pain, unspecified: Secondary | ICD-10-CM

## 2019-09-15 DIAGNOSIS — S81031S Puncture wound without foreign body, right knee, sequela: Secondary | ICD-10-CM

## 2019-09-15 DIAGNOSIS — M25562 Pain in left knee: Secondary | ICD-10-CM

## 2019-09-15 DIAGNOSIS — Z79899 Other long term (current) drug therapy: Secondary | ICD-10-CM

## 2019-09-15 DIAGNOSIS — W3400XS Accidental discharge from unspecified firearms or gun, sequela: Secondary | ICD-10-CM | POA: Insufficient documentation

## 2019-09-15 DIAGNOSIS — M899 Disorder of bone, unspecified: Secondary | ICD-10-CM

## 2019-09-15 DIAGNOSIS — F1291 Cannabis use, unspecified, in remission: Secondary | ICD-10-CM | POA: Insufficient documentation

## 2019-09-16 NOTE — Progress Notes (Signed)
I called patient and let him know to complete both labs and xrays. Instructed patient to call us once this has been done for a Virtual follow up s/p test.

## 2020-07-18 ENCOUNTER — Ambulatory Visit (HOSPITAL_COMMUNITY)
Admission: EM | Admit: 2020-07-18 | Discharge: 2020-07-18 | Disposition: A | Discharge status: Home / Self Care | Payer: Medicare Other | Attending: Family Medicine | Admitting: Family Medicine

## 2020-07-18 ENCOUNTER — Encounter (HOSPITAL_COMMUNITY): Payer: Self-pay | Admitting: *Deleted

## 2020-07-18 ENCOUNTER — Other Ambulatory Visit: Payer: Self-pay

## 2020-07-18 DIAGNOSIS — K05219 Aggressive periodontitis, localized, unspecified severity: Secondary | ICD-10-CM

## 2020-07-18 HISTORY — DX: Essential (primary) hypertension: I10

## 2020-07-18 MED ORDER — AMOXICILLIN-POT CLAVULANATE 875-125 MG PO TABS: 1.0000 | ORAL_TABLET | Freq: Two times a day (BID) | ORAL | 0 refills | Status: DC

## 2020-07-18 MED ORDER — LIDOCAINE VISCOUS HCL 2 % MT SOLN: 10.0000 mL | OROMUCOSAL | 0 refills | Status: DC | PRN

## 2020-07-18 NOTE — ED Triage Notes (Signed)
C/O upper gum swelling x 1 wk without fever.
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
## 2020-07-20 ENCOUNTER — Other Ambulatory Visit: Payer: Self-pay

## 2020-07-20 ENCOUNTER — Ambulatory Visit (INDEPENDENT_AMBULATORY_CARE_PROVIDER_SITE_OTHER): Payer: Medicare Other | Admitting: Primary Care

## 2020-07-20 ENCOUNTER — Encounter (INDEPENDENT_AMBULATORY_CARE_PROVIDER_SITE_OTHER): Payer: Self-pay | Admitting: Primary Care

## 2020-07-20 VITALS — BP 121/81 | HR 82 | Temp 97.0°F | Ht 67.0 in | Wt 168.4 lb

## 2020-07-20 DIAGNOSIS — Z23 Encounter for immunization: Secondary | ICD-10-CM | POA: Diagnosis not present

## 2020-07-20 DIAGNOSIS — I10 Essential (primary) hypertension: Secondary | ICD-10-CM

## 2020-07-20 DIAGNOSIS — Z1211 Encounter for screening for malignant neoplasm of colon: Secondary | ICD-10-CM

## 2020-07-20 DIAGNOSIS — M25561 Pain in right knee: Secondary | ICD-10-CM

## 2020-07-20 DIAGNOSIS — Z125 Encounter for screening for malignant neoplasm of prostate: Secondary | ICD-10-CM

## 2020-07-20 DIAGNOSIS — M545 Low back pain, unspecified: Secondary | ICD-10-CM

## 2020-07-20 DIAGNOSIS — G894 Chronic pain syndrome: Secondary | ICD-10-CM

## 2020-07-20 DIAGNOSIS — G8929 Other chronic pain: Secondary | ICD-10-CM

## 2020-07-20 DIAGNOSIS — M25562 Pain in left knee: Secondary | ICD-10-CM

## 2020-07-20 DIAGNOSIS — Z13220 Encounter for screening for lipoid disorders: Secondary | ICD-10-CM

## 2020-07-20 MED ORDER — AMLODIPINE BESYLATE 10 MG PO TABS: 10.0000 mg | ORAL_TABLET | Freq: Every day | ORAL | 3 refills | Status: DC

## 2020-07-20 MED ORDER — HYDROCHLOROTHIAZIDE 25 MG PO TABS: 25.0000 mg | ORAL_TABLET | Freq: Every day | ORAL | 3 refills | Status: DC

## 2020-07-20 NOTE — Patient Instructions (Signed)
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
## 2020-07-21 LAB — LIPID PANEL
Chol/HDL Ratio: 2.6 ratio (ref 0.0–5.0)
Cholesterol, Total: 190 mg/dL (ref 100–199)
HDL: 72 mg/dL (ref >39)
LDL Chol Calc (NIH): 95 mg/dL (ref 0–99)
Triglycerides: 134 mg/dL (ref 0–149)
VLDL Cholesterol Cal: 23 mg/dL (ref 5–40)

## 2020-07-21 LAB — CMP14+EGFR
ALT: 29 IU/L (ref 0–44)
AST: 48 IU/L — ABNORMAL HIGH (ref 0–40)
Albumin/Globulin Ratio: 1.8 (ref 1.2–2.2)
Albumin: 4.8 g/dL (ref 3.8–4.9)
Alkaline Phosphatase: 81 IU/L (ref 44–121)
BUN/Creatinine Ratio: 14 (ref 9–20)
BUN: 12 mg/dL (ref 6–24)
Bilirubin Total: 0.6 mg/dL (ref 0.0–1.2)
CO2: 21 mmol/L (ref 20–29)
Calcium: 9.8 mg/dL (ref 8.7–10.2)
Chloride: 99 mmol/L (ref 96–106)
Creatinine, Ser: 0.83 mg/dL (ref 0.76–1.27)
GFR calc Af Amer: 115 mL/min/{1.73_m2} (ref >59)
GFR calc non Af Amer: 100 mL/min/{1.73_m2} (ref >59)
Globulin, Total: 2.6 g/dL (ref 1.5–4.5)
Glucose: 93 mg/dL (ref 65–99)
Potassium: 4.6 mmol/L (ref 3.5–5.2)
Sodium: 136 mmol/L (ref 134–144)
Total Protein: 7.4 g/dL (ref 6.0–8.5)

## 2020-07-21 LAB — CBC WITH DIFFERENTIAL/PLATELET
Basophils Absolute: 0.1 10*3/uL (ref 0.0–0.2)
Basos: 1 %
EOS (ABSOLUTE): 0.1 10*3/uL (ref 0.0–0.4)
Eos: 1 %
Hematocrit: 42.3 % (ref 37.5–51.0)
Hemoglobin: 14.5 g/dL (ref 13.0–17.7)
Immature Grans (Abs): 0 10*3/uL (ref 0.0–0.1)
Immature Granulocytes: 0 %
Lymphocytes Absolute: 0.9 10*3/uL (ref 0.7–3.1)
Lymphs: 16 %
MCH: 33.7 pg — ABNORMAL HIGH (ref 26.6–33.0)
MCHC: 34.3 g/dL (ref 31.5–35.7)
MCV: 98 fL — ABNORMAL HIGH (ref 79–97)
Monocytes Absolute: 0.6 10*3/uL (ref 0.1–0.9)
Monocytes: 10 %
Neutrophils Absolute: 4.1 10*3/uL (ref 1.4–7.0)
Neutrophils: 72 %
Platelets: 411 10*3/uL (ref 150–450)
RBC: 4.3 x10E6/uL (ref 4.14–5.80)
RDW: 11.8 % (ref 11.6–15.4)
WBC: 5.8 10*3/uL (ref 3.4–10.8)

## 2020-07-21 LAB — PSA: Prostate Specific Ag, Serum: 1.2 ng/mL (ref 0.0–4.0)

## 2020-07-22 ENCOUNTER — Telehealth (INDEPENDENT_AMBULATORY_CARE_PROVIDER_SITE_OTHER): Payer: Self-pay

## 2020-07-22 NOTE — Telephone Encounter (Signed)
-----   Message from [name], NP sent at 07/22/2020  2:13 PM EST ----- Labs are normal

## 2020-07-22 NOTE — Telephone Encounter (Signed)
Patient verified date of birth. He is aware that labs are normal. Brei Pociask S Stephnie Parlier, CMA  

## 2020-08-10 ENCOUNTER — Ambulatory Visit (INDEPENDENT_AMBULATORY_CARE_PROVIDER_SITE_OTHER): Payer: Self-pay

## 2020-08-10 ENCOUNTER — Telehealth (INDEPENDENT_AMBULATORY_CARE_PROVIDER_SITE_OTHER): Payer: Self-pay | Admitting: Primary Care

## 2020-08-10 NOTE — Telephone Encounter (Signed)
Pt called and wanted an appt for his Hemorid /Pt stated they are painful and flared up bad/ pt was advised of appt available on 1.31.22 and also of appt at [facility] / pt was not able to wait that long and asked of Sharyn Lull can send in something for it/ please advise asap

## 2020-08-10 NOTE — Telephone Encounter (Signed)
Patient called stating that he has a hemorrhoid that is painful and bleeding.  He states that he has been to the pharmacy and they prescribed tucks pads.  He states that he sees a spot of blood on the tucks when he changes it.  He denies constipation. He states it is very painful to pass stool. Per protocol patient will call back tomorrow for advice. He does not want to go to UC . He was advised that if bleeding soaks through the tucks and is constant he must seek medical care. He verbalized understanding of all information. He will call back in am for possible work in   Reason for Disposition . [1] Home treatment > 3 days for rectal pain AND [2] not improved  Answer Assessment - Initial Assessment Questions 1. SYMPTOM:  "What's the main symptom you're concerned about?" (e.g., pain, itching, swelling, rash)     henoriod 2. ONSET: "When did the symptom  start?"     1 week 3. RECTAL PAIN: "Do you have any pain around your rectum?" "How bad is the pain?"  (Scale 1-10; or mild, moderate, severe)  - MILD (1-3): doesn't interfere with normal activities   - MODERATE (4-7): interferes with normal activities or awakens from sleep, limping   - SEVERE (8-10): excruciating pain, unable to have a bowel movement     Severe with BM 4. RECTAL ITCHING: "Do you have any itching in this area?" "How bad is the itching?"  (Scale 1-10; or mild, moderate, severe)  - MILD - doesn't interfere with normal activities   - MODERATE-SEVERE: interferes with normal activities or awakens from sleep     A little 5. CONSTIPATION: "Do you have constipation?" If Yes, ask: "How bad is it?"    no 6. CAUSE: "What do you think is causing the anus symptoms?"     hemmoriod 7. OTHER SYMPTOMS: "Do you have any other symptoms?"  (e.g., rectal bleeding, abdominal pain, vomiting, fever)   Spot of blood 8. PREGNANCY: "Is there any chance you are pregnant?" "When was your last menstrual period?"    N/A  Protocols used: RECTAL  [facility]

## 2020-08-10 NOTE — Telephone Encounter (Signed)
Pt has called back stating is is hurting so bad he feels he needs to go to UC, explained to pt that we would return call asap but office has pt waiting and will cb as soon as possible.

## 2020-08-10 NOTE — Telephone Encounter (Signed)
Sent to PCP ?

## 2020-08-11 ENCOUNTER — Other Ambulatory Visit (INDEPENDENT_AMBULATORY_CARE_PROVIDER_SITE_OTHER): Payer: Self-pay | Admitting: Primary Care

## 2020-08-11 DIAGNOSIS — K625 Hemorrhage of anus and rectum: Secondary | ICD-10-CM

## 2020-08-11 MED ORDER — HYDROCORTISONE ACETATE 25 MG RE SUPP: 25.0000 mg | Freq: Two times a day (BID) | RECTAL | 0 refills | Status: DC

## 2020-08-12 NOTE — Telephone Encounter (Signed)
Left message asking patient to return call to [phone].

## 2020-08-19 ENCOUNTER — Encounter: Payer: Self-pay | Admitting: Gastroenterology

## 2020-09-02 ENCOUNTER — Ambulatory Visit (INDEPENDENT_AMBULATORY_CARE_PROVIDER_SITE_OTHER): Payer: Medicare Other | Admitting: Primary Care

## 2020-09-02 ENCOUNTER — Other Ambulatory Visit: Payer: Self-pay

## 2020-09-02 ENCOUNTER — Encounter (INDEPENDENT_AMBULATORY_CARE_PROVIDER_SITE_OTHER): Payer: Self-pay | Admitting: Primary Care

## 2020-09-02 ENCOUNTER — Telehealth (INDEPENDENT_AMBULATORY_CARE_PROVIDER_SITE_OTHER): Payer: Self-pay

## 2020-09-02 VITALS — BP 120/74 | HR 87 | Wt 158.6 lb

## 2020-09-02 DIAGNOSIS — M25561 Pain in right knee: Secondary | ICD-10-CM | POA: Diagnosis not present

## 2020-09-02 DIAGNOSIS — M25562 Pain in left knee: Secondary | ICD-10-CM

## 2020-09-02 DIAGNOSIS — Z1211 Encounter for screening for malignant neoplasm of colon: Secondary | ICD-10-CM

## 2020-09-02 DIAGNOSIS — G4700 Insomnia, unspecified: Secondary | ICD-10-CM

## 2020-09-02 DIAGNOSIS — G8929 Other chronic pain: Secondary | ICD-10-CM | POA: Diagnosis not present

## 2020-09-02 DIAGNOSIS — K625 Hemorrhage of anus and rectum: Secondary | ICD-10-CM | POA: Diagnosis not present

## 2020-09-02 DIAGNOSIS — Z23 Encounter for immunization: Secondary | ICD-10-CM | POA: Diagnosis not present

## 2020-09-02 MED ORDER — HYDROCORTISONE ACETATE 25 MG RE SUPP: 25.0000 mg | Freq: Two times a day (BID) | RECTAL | 3 refills | Status: DC

## 2020-09-02 MED ORDER — MELATONIN 5 MG PO CAPS: 5.0000 mg | ORAL_CAPSULE | Freq: Every evening | ORAL | 1 refills | Status: DC

## 2020-09-02 NOTE — Patient Instructions (Signed)
Can try melatonin 5mg-15 mg at night for sleep, can also do benadryl 25-50mg at night for sleep.  If this does not help we can try prescription medication.  Also here is some information about good sleep hygiene.   Insomnia Insomnia is frequent trouble falling and/or staying asleep. Insomnia can be a long term problem or a short term problem. Both are common. Insomnia can be a short term problem when the wakefulness is related to a certain stress or worry. Long term insomnia is often related to ongoing stress during waking hours and/or poor sleeping habits. Overtime, sleep deprivation itself can make the problem worse. Every little thing feels more severe because you are overtired and your ability to cope is decreased. CAUSES  Stress, anxiety, and depression. Poor sleeping habits. Distractions such as TV in the bedroom. Naps close to bedtime. Engaging in emotionally charged conversations before bed. Technical reading before sleep. Alcohol and other sedatives. They may make the problem worse. They can hurt normal sleep patterns and normal dream activity. Stimulants such as caffeine for several hours prior to bedtime. Pain syndromes and shortness of breath can cause insomnia. Exercise late at night. Changing time zones may cause sleeping problems (jet lag). It is sometimes helpful to have someone observe your sleeping patterns. They should look for periods of not breathing during the night (sleep apnea). They should also look to see how long those periods last. If you live alone or observers are uncertain, you can also be observed at a sleep clinic where your sleep patterns will be professionally monitored. Sleep apnea requires a checkup and treatment. Give your caregivers your medical history. Give your caregivers observations your family has made about your sleep.  SYMPTOMS  Not feeling rested in the morning. Anxiety and restlessness at bedtime. Difficulty falling and staying asleep. TREATMENT   Your caregiver may prescribe treatment for an underlying medical disorders. Your caregiver can give advice or help if you are using alcohol or other drugs for self-medication. Treatment of underlying problems will usually eliminate insomnia problems. Medications can be prescribed for short time use. They are generally not recommended for lengthy use. Over-the-counter sleep medicines are not recommended for lengthy use. They can be habit forming. You can promote easier sleeping by making lifestyle changes such as: Using relaxation techniques that help with breathing and reduce muscle tension. Exercising earlier in the day. Changing your diet and the time of your last meal. No night time snacks. Establish a regular time to go to bed. Counseling can help with stressful problems and worry. Soothing music and white noise may be helpful if there are background noises you cannot remove. Stop tedious detailed work at least one hour before bedtime. HOME CARE INSTRUCTIONS  Keep a diary. Inform your caregiver about your progress. This includes any medication side effects. See your caregiver regularly. Take note of: Times when you are asleep. Times when you are awake during the night. The quality of your sleep. How you feel the next day. This information will help your caregiver care for you. Get out of bed if you are still awake after 15 minutes. Read or do some quiet activity. Keep the lights down. Wait until you feel sleepy and go back to bed. Keep regular sleeping and waking hours. Avoid naps. Exercise regularly. Avoid distractions at bedtime. Distractions include watching television or engaging in any intense or detailed activity like attempting to balance the household checkbook. Develop a bedtime ritual. Keep a familiar routine of bathing, brushing your teeth,   climbing into bed at the same time each night, listening to soothing music. Routines increase the success of falling to sleep faster. Use  relaxation techniques. This can be using breathing and muscle tension release routines. It can also include visualizing peaceful scenes. You can also help control troubling or intruding thoughts by keeping your mind occupied with boring or repetitive thoughts like the old concept of counting sheep. You can make it more creative like imagining planting one beautiful flower after another in your backyard garden. During your day, work to eliminate stress. When this is not possible use some of the previous suggestions to help reduce the anxiety that accompanies stressful situations. MAKE SURE YOU:  Understand these instructions. Will watch your condition. Will get help right away if you are not doing well or get worse. Document Released: 07/14/2000 Document Revised: 10/09/2011 Document Reviewed: 08/14/2007 Exit[employer] Patient Information 2015 [employer]. This information is not intended to replace advice given to you by your health care provider. Make sure you discuss any questions you have with your health care provider.  

## 2020-09-02 NOTE — Telephone Encounter (Signed)
Copied from [place] [id number]. Topic: General - Other >> Sep 02, 2020 12:13 PM Leward Quan A wrote: Reason for CRM: Patient called in requesting his Rxes to go over to [facility] say that none are covered by his insurance at the previous pharmacy Ph#  [phone]
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
## 2020-09-02 NOTE — Progress Notes (Signed)
Insomnia  Hemorrhoid 3weeks-hydrocortisone suppository sent to wrong pharmacy pt did not pickup

## 2020-09-04 ENCOUNTER — Encounter (INDEPENDENT_AMBULATORY_CARE_PROVIDER_SITE_OTHER): Payer: Self-pay | Admitting: Primary Care

## 2020-09-09 ENCOUNTER — Ambulatory Visit: Payer: Self-pay

## 2020-09-09 ENCOUNTER — Ambulatory Visit (INDEPENDENT_AMBULATORY_CARE_PROVIDER_SITE_OTHER): Payer: Medicare Other | Admitting: Orthopaedic Surgery

## 2020-09-09 ENCOUNTER — Encounter: Payer: Self-pay | Admitting: Orthopaedic Surgery

## 2020-09-09 VITALS — Ht 67.0 in | Wt 150.0 lb

## 2020-09-09 DIAGNOSIS — M25561 Pain in right knee: Secondary | ICD-10-CM

## 2020-09-09 DIAGNOSIS — G8929 Other chronic pain: Secondary | ICD-10-CM

## 2020-09-09 DIAGNOSIS — M25562 Pain in left knee: Secondary | ICD-10-CM | POA: Diagnosis not present

## 2020-09-09 MED ORDER — DICLOFENAC SODIUM 1 % EX GEL: 2.0000 g | Freq: Four times a day (QID) | CUTANEOUS | 0 refills | Status: DC
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
## 2020-09-21 ENCOUNTER — Ambulatory Visit (AMBULATORY_SURGERY_CENTER): Payer: Medicare Other

## 2020-09-21 ENCOUNTER — Other Ambulatory Visit: Payer: Self-pay

## 2020-09-21 VITALS — Ht 67.5 in | Wt 156.0 lb

## 2020-09-21 DIAGNOSIS — Z1211 Encounter for screening for malignant neoplasm of colon: Secondary | ICD-10-CM

## 2020-09-21 MED ORDER — NA SULFATE-K SULFATE-MG SULF 17.5-3.13-1.6 GM/177ML PO SOLN: 1.0000 | Freq: Once | ORAL | 0 refills | Status: AC

## 2020-09-21 NOTE — Progress Notes (Signed)
Pre visit completed via phone call; patient verified name, DOB, and address; No egg or soy allergy known to patient;  No issues with past sedation with any surgeries or procedures No intubation problems in the past;  No FH of Malignant Hyperthermia No diet pills per patient No home 02 use per patient  No blood thinners per patient  Pt denies issues with constipation  No A fib or A flutter  COVID 19 guidelines implemented in [facility] today with Pt and RN  Pt is fully vaccinated for Covid x 2 +booster= Pt denies loose or missing teeth; Patient denies dentures, partials, dental implants, capped or bonded teeth; Patient reports he has a "chipped tooth"; Coupon given to pt in PV today , Code to Pharmacy and  NO PA's for preps discussed with pt in PV today  Discussed with pt there will be an out-of-pocket cost for prep and that varies from $0 to 70 dollars  Due to the COVID-19 pandemic we are asking patients to follow certain guidelines.  Pt aware of COVID protocols and [facility] guidelines

## 2020-10-05 ENCOUNTER — Encounter: Payer: Self-pay | Admitting: Gastroenterology

## 2020-10-05 ENCOUNTER — Ambulatory Visit (AMBULATORY_SURGERY_CENTER): Payer: Medicare Other | Admitting: Gastroenterology

## 2020-10-05 ENCOUNTER — Other Ambulatory Visit: Payer: Self-pay

## 2020-10-05 VITALS — BP 124/73 | HR 64 | Temp 98.3°F | Resp 20 | Ht 67.0 in | Wt 156.0 lb

## 2020-10-05 DIAGNOSIS — D123 Benign neoplasm of transverse colon: Secondary | ICD-10-CM

## 2020-10-05 DIAGNOSIS — D3A023 Benign carcinoid tumor of the transverse colon: Secondary | ICD-10-CM | POA: Diagnosis not present

## 2020-10-05 DIAGNOSIS — Z1211 Encounter for screening for malignant neoplasm of colon: Secondary | ICD-10-CM

## 2020-10-05 DIAGNOSIS — D3A026 Benign carcinoid tumor of the rectum: Secondary | ICD-10-CM

## 2020-10-05 DIAGNOSIS — D128 Benign neoplasm of rectum: Secondary | ICD-10-CM

## 2020-10-05 MED ORDER — SODIUM CHLORIDE 0.9 % IV SOLN: 500.0000 mL | Freq: Once | INTRAVENOUS | Status: AC

## 2020-10-05 NOTE — Op Note (Signed)
[name] Patient Name: Gabriel Mccann Procedure Date: 10/05/2020 9:12 AM MRN: [id number] Endoscopist: Milus Banister , MD Age: 55 Referring MD:  Date of Birth: 07/05/1966 Gender: Male Account #: [id number] Procedure:                Colonoscopy Indications:              Screening for colorectal malignant neoplasm Medicines:                Monitored Anesthesia Care Procedure:                Pre-Anesthesia Assessment:                           - Prior to the procedure, a History and Physical                            was performed, and patient medications and                            allergies were reviewed. The patient's tolerance of                            previous anesthesia was also reviewed. The risks                            and benefits of the procedure and the sedation                            options and risks were discussed with the patient.                            All questions were answered, and informed consent                            was obtained. Prior Anticoagulants: The patient has                            taken no previous anticoagulant or antiplatelet                            agents. ASA Grade Assessment: II - A patient with                            mild systemic disease. After reviewing the risks                            and benefits, the patient was deemed in                            satisfactory condition to undergo the procedure.                           After obtaining informed consent, the colonoscope  was passed under direct vision. Throughout the                            procedure, the patient's blood pressure, pulse, and                            oxygen saturations were monitored continuously. The                            Olympus [id number]) Colonoscope was                            introduced through the anus and advanced to the the                            cecum, identified by  appendiceal orifice and                            ileocecal valve. The colonoscopy was performed                            without difficulty. The patient tolerated the                            procedure well. The quality of the bowel                            preparation was good. The ileocecal valve,                            appendiceal orifice, and rectum were photographed. Scope In: 9:17:53 AM Scope Out: 9:28:04 AM Scope Withdrawal Time: 0 hours 7 minutes 56 seconds  Total Procedure Duration: 0 hours 10 minutes 11 seconds  Findings:                 Two sessile polyps were found in the rectum and                            transverse colon. The polyps were 3 to 6 mm in                            size. These polyps were removed with a cold snare.                            Resection and retrieval were complete. The rectal                            polyp was slightly heaped up, may be a very small                            carcinoid lesion.                           External and internal hemorrhoids were  found. The                            hemorrhoids were small.                           The exam was otherwise without abnormality on                            direct and retroflexion views. Complications:            No immediate complications. Estimated blood loss:                            None. Estimated Blood Loss:     Estimated blood loss: none. Impression:               - Two 3 to 6 mm polyps in the rectum and in the                            transverse colon, removed with a cold snare.                            Resected and retrieved.                           - External and internal hemorrhoids.                           - The examination was otherwise normal on direct                            and retroflexion views. Recommendation:           - Patient has a contact number available for                            emergencies. The signs and symptoms of potential                             delayed complications were discussed with the                            patient. Return to normal activities tomorrow.                            Written discharge instructions were provided to the                            patient.                           - Resume previous diet.                           - Continue present medications.                           -  Await pathology results. Milus Banister, MD 10/05/2020 9:31:45 AM This report has been signed electronically.

## 2020-10-05 NOTE — Patient Instructions (Signed)
YOU HAD AN ENDOSCOPIC PROCEDURE TODAY AT THE [facility]:   Refer to the procedure report that was given to you for any specific questions about what was found during the examination.  If the procedure report does not answer your questions, please call your gastroenterologist to clarify.  If you requested that your care partner not be given the details of your procedure findings, then the procedure report has been included in a sealed envelope for you to review at your convenience later.  YOU SHOULD EXPECT: Some feelings of bloating in the abdomen. Passage of more gas than usual.  Walking can help get rid of the air that was put into your GI tract during the procedure and reduce the bloating. If you had a lower endoscopy (such as a colonoscopy or flexible sigmoidoscopy) you may notice spotting of blood in your stool or on the toilet paper. If you underwent a bowel prep for your procedure, you may not have a normal bowel movement for a few days.  Please Note:  You might notice some irritation and congestion in your nose or some drainage.  This is from the oxygen used during your procedure.  There is no need for concern and it should clear up in a day or so.  SYMPTOMS TO REPORT IMMEDIATELY:  Following lower endoscopy (colonoscopy or flexible sigmoidoscopy):  Excessive amounts of blood in the stool  Significant tenderness or worsening of abdominal pains  Swelling of the abdomen that is new, acute  Fever of 100F or higher   For urgent or emergent issues, a gastroenterologist can be reached at any hour by calling [phone]. Do not use MyChart messaging for urgent concerns.    DIET:  We do recommend a small meal at first, but then you may proceed to your regular diet.  Drink plenty of fluids but you should avoid alcoholic beverages for 24 hours.  MEDICATIONS:  Continue present medications.  Please see handouts given to you by your recovery nurse.  Thank you for allowing us to  provide for your healthcare needs today.  ACTIVITY:  You should plan to take it easy for the rest of today and you should NOT DRIVE or use heavy machinery until tomorrow (because of the sedation medicines used during the test).    FOLLOW UP: Our staff will call the number listed on your records 48-72 hours following your procedure to check on you and address any questions or concerns that you may have regarding the information given to you following your procedure. If we do not reach you, we will leave a message.  We will attempt to reach you two times.  During this call, we will ask if you have developed any symptoms of COVID 19. If you develop any symptoms (ie: fever, flu-like symptoms, shortness of breath, cough etc.) before then, please call [phone].  If you test positive for Covid 19 in the 2 weeks post procedure, please call and report this information to us.    If any biopsies were taken you will be contacted by phone or by letter within the next 1-3 weeks.  Please call us at [phone] if you have not heard about the biopsies in 3 weeks.    SIGNATURES/CONFIDENTIALITY: You and/or your care partner have signed paperwork which will be entered into your electronic medical record.  These signatures attest to the fact that that the information above on your After Visit Summary has been reviewed and is understood.  Full responsibility of the   confidentiality of this discharge information lies with you and/or your care-partner.  

## 2020-10-05 NOTE — Progress Notes (Signed)
Called to room to assist during endoscopic procedure.  Patient ID and intended procedure confirmed with present staff. Received instructions for my participation in the procedure from the performing physician.  

## 2020-10-05 NOTE — Progress Notes (Signed)
C.W. vital signs. 

## 2020-10-05 NOTE — Progress Notes (Signed)
pt tolerated well. VSS. awake and to recovery. Report given to RN.  

## 2020-10-05 NOTE — Progress Notes (Signed)
Pt's states no medical or surgical changes since previsit or office visit. 

## 2020-10-06 ENCOUNTER — Ambulatory Visit: Payer: Medicare Other | Admitting: Physical Therapy

## 2020-10-07 ENCOUNTER — Telehealth: Payer: Self-pay

## 2020-10-07 NOTE — Telephone Encounter (Signed)
  Follow up Call-  Call back number 10/05/2020  Post procedure Call Back phone  # [phone]  Permission to leave phone message Yes  Some recent data might be hidden     Patient questions:  Do you have a fever, pain , or abdominal swelling? No. Pain Score  0 *  Have you tolerated food without any problems? Yes.    Have you been able to return to your normal activities? Yes.    Do you have any questions about your discharge instructions: Diet   No. Medications  No. Follow up visit  No.  Do you have questions or concerns about your Care? No.  Actions: * If pain score is 4 or above: No action needed, pain <4.  1. Have you developed a fever since your procedure? no  2.   Have you had an respiratory symptoms (SOB or cough) since your procedure? no  3.   Have you tested positive for COVID 19 since your procedure no  4.   Have you had any family members/close contacts diagnosed with the COVID 19 since your procedure?  no   If yes to any of these questions please route to Joylene John, RN and Joella Prince, RN

## 2020-10-19 ENCOUNTER — Encounter: Payer: Self-pay | Admitting: Gastroenterology

## 2020-12-02 IMAGING — CR DG FOOT COMPLETE 3+V*L*
3 series · 3 of 3 positions shown · non-contrast
Comparison: None.

CLINICAL DATA: Set status post fall. Right knee and left ankle
pain.

EXAM:
LEFT FOOT - COMPLETE 3+ VIEW

[foot ap]
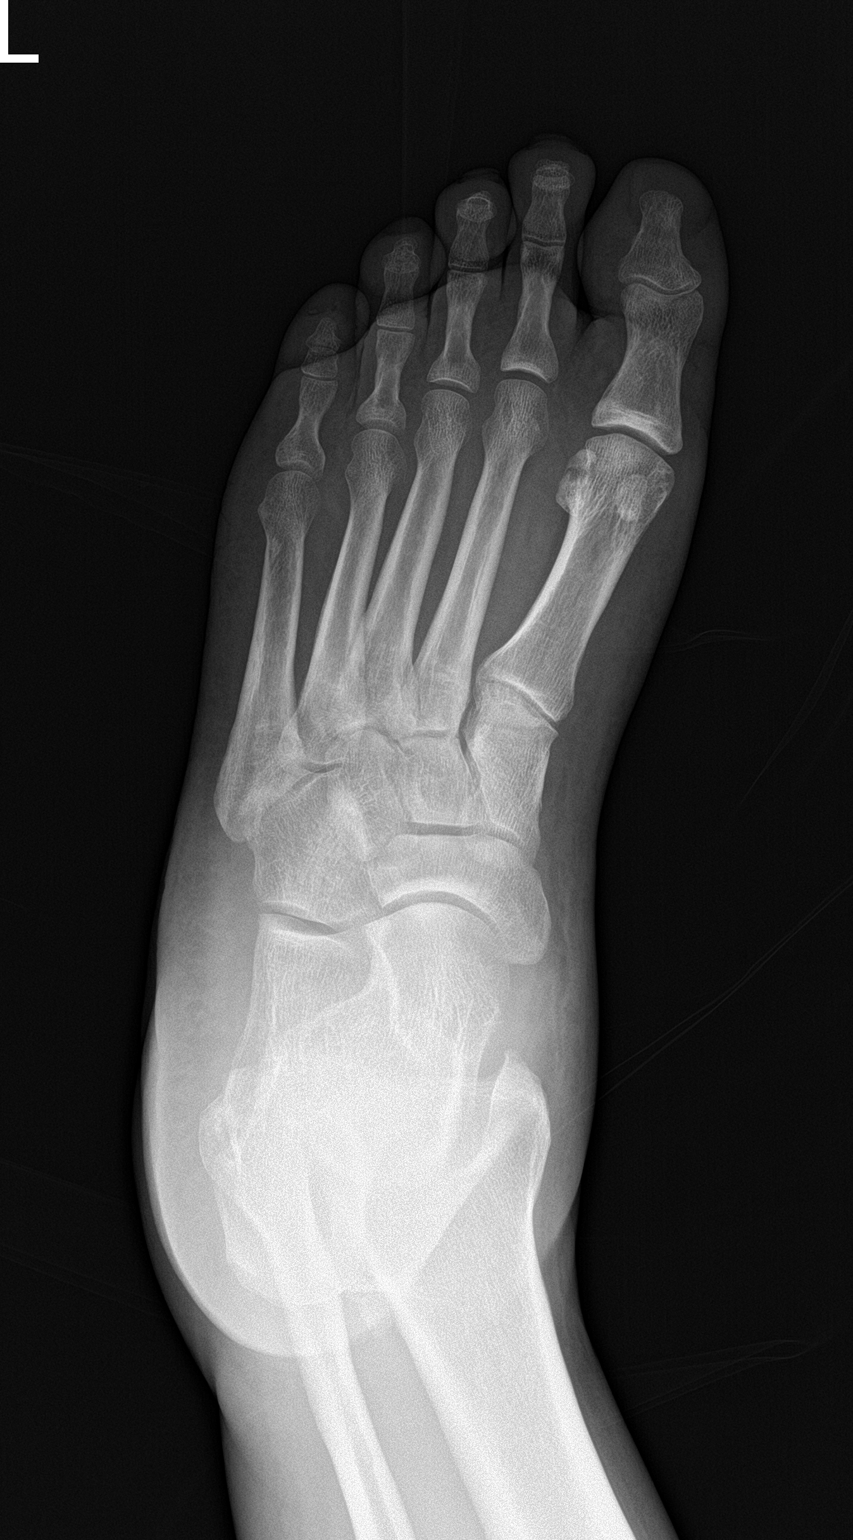

[foot obl]
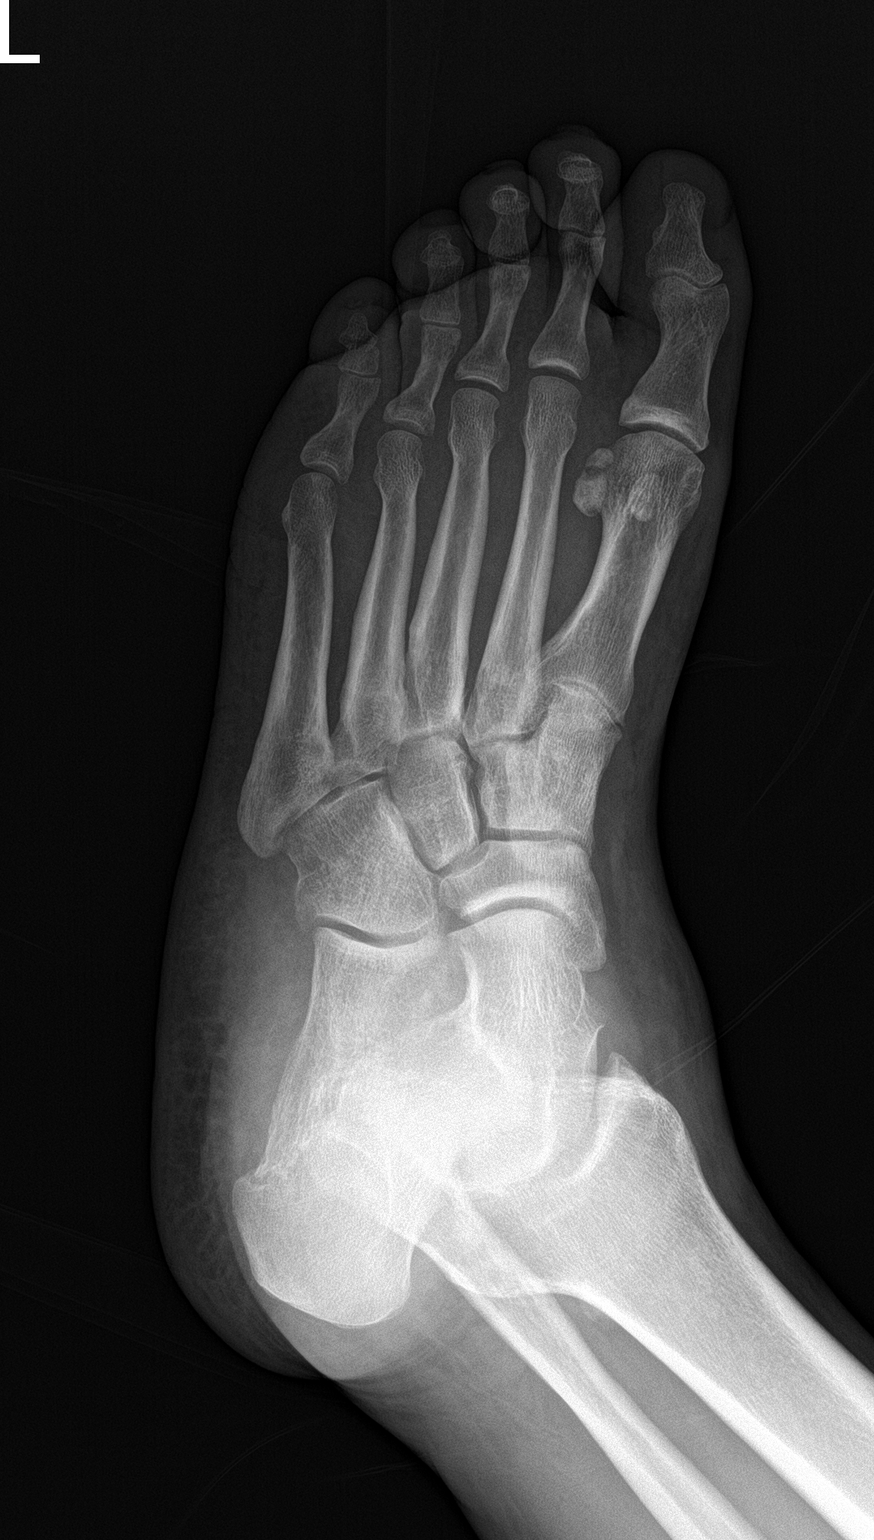

[foot lat]
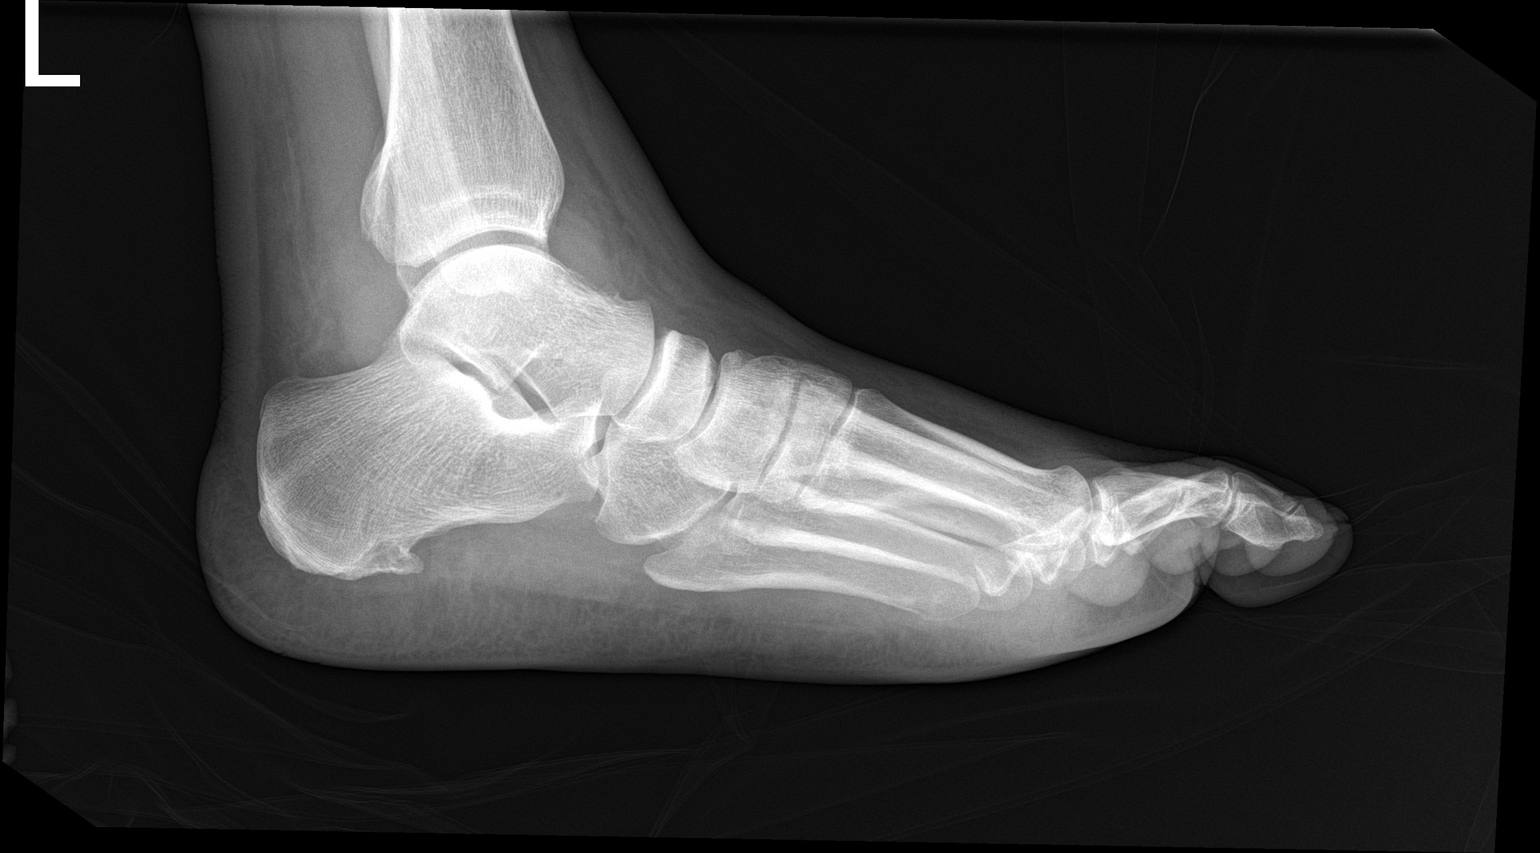

[3 of 3 positions shown; findings below may reference images not displayed]

FINDINGS: There is no evidence of fracture or dislocation. There is no
evidence of arthropathy or other focal bone abnormality. There is a
bipartite lateral hallux sesamoid. There is a small plantar
calcaneal spur. Soft tissues are unremarkable.
IMPRESSION: No acute osseous injury of the left foot.

## 2021-01-06 ENCOUNTER — Telehealth (INDEPENDENT_AMBULATORY_CARE_PROVIDER_SITE_OTHER): Payer: Self-pay

## 2021-01-06 NOTE — Telephone Encounter (Signed)
Does patient need appointment to discuss? 

## 2021-01-06 NOTE — Telephone Encounter (Signed)
Copied from [place] [id number]. Topic: General - Other >> Jan 05, 2021 12:42 PM Pawlus, Brayton Layman A wrote: Reason for CRM: Pt needs a doctors note stating that he is unable to go up a lot of steps. Please advise.

## 2021-02-03 NOTE — Telephone Encounter (Signed)
Pt calling in again and is requesting to still have this letter so that they can move him within his apartment complex. Pt states that it is urgent that he get this. Please advise.

## 2021-02-04 NOTE — Telephone Encounter (Signed)
Note accommodation are due to orthopedic problems and surgeries - sent note to Dr. Gale Journey if in agreement moving without steps . PCP will provide documentation. Gave patient same information.

## 2021-03-02 ENCOUNTER — Ambulatory Visit (INDEPENDENT_AMBULATORY_CARE_PROVIDER_SITE_OTHER): Payer: Medicare Other | Admitting: Primary Care

## 2021-03-17 ENCOUNTER — Encounter (INDEPENDENT_AMBULATORY_CARE_PROVIDER_SITE_OTHER): Payer: Self-pay | Admitting: Nurse Practitioner

## 2021-03-17 ENCOUNTER — Ambulatory Visit (INDEPENDENT_AMBULATORY_CARE_PROVIDER_SITE_OTHER): Payer: Medicare Other | Admitting: Nurse Practitioner

## 2021-03-17 ENCOUNTER — Other Ambulatory Visit: Payer: Self-pay

## 2021-03-17 VITALS — BP 108/70 | HR 75 | Temp 97.5°F | Ht 67.0 in | Wt 152.0 lb

## 2021-03-17 DIAGNOSIS — I10 Essential (primary) hypertension: Secondary | ICD-10-CM

## 2021-03-17 DIAGNOSIS — G4700 Insomnia, unspecified: Secondary | ICD-10-CM | POA: Diagnosis not present

## 2021-03-17 NOTE — Patient Instructions (Addendum)
Hypertension:  Continue current medications  Stay active  Low sodium diet  Insomnia:  Referral placed to sleep / pulmonary  Melatonin 1 mg + Magnesium 100 - 300 mg 1 hour before bedtime   Follow up:  Follow up in 4 months for CPE with fasting labs   Hypertension, Adult Hypertension is another name for high blood pressure. High blood pressure forces your heart to work harder to pump blood. This can cause problems overtime. There are two numbers in a blood pressure reading. There is a top number (systolic) over a bottom number (diastolic). It is best to have a blood pressure that is below 120/80. Healthy choicescan help lower your blood pressure, or you may need medicine to help lower it. What are the causes? The cause of this condition is not known. Some conditions may be related tohigh blood pressure. What increases the risk? Smoking. Having type 2 diabetes mellitus, high cholesterol, or both. Not getting enough exercise or physical activity. Being overweight. Having too much fat, sugar, calories, or salt (sodium) in your diet. Drinking too much alcohol. Having long-term (chronic) kidney disease. Having a family history of high blood pressure. Age. Risk increases with age. Race. You may be at higher risk if you are African American. Gender. Men are at higher risk than women before age 70. After age 3, women are at higher risk than men. Having obstructive sleep apnea. Stress. What are the signs or symptoms? High blood pressure may not cause symptoms. Very high blood pressure (hypertensive crisis) may cause: Headache. Feelings of worry or nervousness (anxiety). Shortness of breath. Nosebleed. A feeling of being sick to your stomach (nausea). Throwing up (vomiting). Changes in how you see. Very bad chest pain. Seizures. How is this treated? This condition is treated by making healthy lifestyle changes, such as: Eating healthy foods. Exercising more. Drinking less  alcohol. Your health care provider may prescribe medicine if lifestyle changes are not enough to get your blood pressure under control, and if: Your top number is above 130. Your bottom number is above 80. Your personal target blood pressure may vary. Follow these instructions at home: Eating and drinking  If told, follow the DASH eating plan. To follow this plan: Fill one half of your plate at each meal with fruits and vegetables. Fill one fourth of your plate at each meal with whole grains. Whole grains include whole-wheat pasta, brown rice, and whole-grain bread. Eat or drink low-fat dairy products, such as skim milk or low-fat yogurt. Fill one fourth of your plate at each meal with low-fat (lean) proteins. Low-fat proteins include fish, chicken without skin, eggs, beans, and tofu. Avoid fatty meat, cured and processed meat, or chicken with skin. Avoid pre-made or processed food. Eat less than 1,500 mg of salt each day. Do not drink alcohol if: Your doctor tells you not to drink. You are pregnant, may be pregnant, or are planning to become pregnant. If you drink alcohol: Limit how much you use to: 0-1 drink a day for women. 0-2 drinks a day for men. Be aware of how much alcohol is in your drink. In the [place] one drink equals one 12 oz bottle of beer (355 mL), one 5 oz glass of wine (148 mL), or one 1 oz glass of hard liquor (44 mL).  Lifestyle  Work with your doctor to stay at a healthy weight or to lose weight. Ask your doctor what the best weight is for you. Get at least 30 minutes of exercise  most days of the week. This may include walking, swimming, or biking. Get at least 30 minutes of exercise that strengthens your muscles (resistance exercise) at least 3 days a week. This may include lifting weights or doing Pilates. Do not use any products that contain nicotine or tobacco, such as cigarettes, e-cigarettes, and chewing tobacco. If you need help quitting, ask your  doctor. Check your blood pressure at home as told by your doctor. Keep all follow-up visits as told by your doctor. This is important.  Medicines Take over-the-counter and prescription medicines only as told by your doctor. Follow directions carefully. Do not skip doses of blood pressure medicine. The medicine does not work as well if you skip doses. Skipping doses also puts you at risk for problems. Ask your doctor about side effects or reactions to medicines that you should watch for. Contact a doctor if you: Think you are having a reaction to the medicine you are taking. Have headaches that keep coming back (recurring). Feel dizzy. Have swelling in your ankles. Have trouble with your vision. Get help right away if you: Get a very bad headache. Start to feel mixed up (confused). Feel weak or numb. Feel faint. Have very bad pain in your: Chest. Belly (abdomen). Throw up more than once. Have trouble breathing. Summary Hypertension is another name for high blood pressure. High blood pressure forces your heart to work harder to pump blood. For most people, a normal blood pressure is less than 120/80. Making healthy choices can help lower blood pressure. If your blood pressure does not get lower with healthy choices, you may need to take medicine. This information is not intended to replace advice given to you by your health care provider. Make sure you discuss any questions you have with your healthcare provider. Document Revised: 03/27/2018 Document Reviewed: 03/27/2018 Elsevier Patient Education  2022 [place] Your Hypertension Hypertension, also called high blood pressure, is when the force of the blood pressing against the walls of the arteries is too strong. Arteries are blood vessels that carry blood from your heart throughout your body. Hypertension forces the heart to work harder to pump blood and may cause the arteries tobecome narrow or stiff. Understanding  blood pressure readings Your personal target blood pressure may vary depending on your medical conditions, your age, and other factors. A blood pressure reading includes a higher number over a lower number. Ideally, your blood pressure should be below 120/80. You should know that: The first, or top, number is called the systolic pressure. It is a measure of the pressure in your arteries as your heart beats. The second, or bottom number, is called the diastolic pressure. It is a measure of the pressure in your arteries as the heart relaxes. Blood pressure is classified into four stages. Based on your blood pressure reading, your health care provider may use the following stages to determine what type of treatment you need, if any. Systolic pressure and diastolicpressure are measured in a unit called mmHg. Normal Systolic pressure: below 123456. Diastolic pressure: below 80. Elevated Systolic pressure: [id number]. Diastolic pressure: below 80. Hypertension stage 1 Systolic pressure: 0000000. Diastolic pressure: [id number]. Hypertension stage 2 Systolic pressure: [id number] or above. Diastolic pressure: 90 or above. How can this condition affect me? Managing your hypertension is an important responsibility. Over time, hypertension can damage the arteries and decrease blood flow to important parts of the body, including the brain, heart, and kidneys. Having untreated or uncontrolled hypertension can lead to:  A heart attack. A stroke. A weakened blood vessel (aneurysm). Heart failure. Kidney damage. Eye damage. Metabolic syndrome. Memory and concentration problems. Vascular dementia. What actions can I take to manage this condition? Hypertension can be managed by making lifestyle changes and possibly by taking medicines. Your health care provider will help you make a plan to bring yourblood pressure within a normal range. Nutrition  Eat a diet that is high in fiber and potassium, and low in salt (sodium),  added sugar, and fat. An example eating plan is called the Dietary Approaches to Stop Hypertension (DASH) diet. To eat this way: Eat plenty of fresh fruits and vegetables. Try to fill one-half of your plate at each meal with fruits and vegetables. Eat whole grains, such as whole-wheat pasta, brown rice, or whole-grain bread. Fill about one-fourth of your plate with whole grains. Eat low-fat dairy products. Avoid fatty cuts of meat, processed or cured meats, and poultry with skin. Fill about one-fourth of your plate with lean proteins such as fish, chicken without skin, beans, eggs, and tofu. Avoid pre-made and processed foods. These tend to be higher in sodium, added sugar, and fat. Reduce your daily sodium intake. Most people with hypertension should eat less than 1,500 mg of sodium a day.  Lifestyle  Work with your health care provider to maintain a healthy body weight or to lose weight. Ask what an ideal weight is for you. Get at least 30 minutes of exercise that causes your heart to beat faster (aerobic exercise) most days of the week. Activities may include walking, swimming, or biking. Include exercise to strengthen your muscles (resistance exercise), such as weight lifting, as part of your weekly exercise routine. Try to do these types of exercises for 30 minutes at least 3 days a week. Do not use any products that contain nicotine or tobacco, such as cigarettes, e-cigarettes, and chewing tobacco. If you need help quitting, ask your health care provider. Control any long-term (chronic) conditions you have, such as high cholesterol or diabetes. Identify your sources of stress and find ways to manage stress. This may include meditation, deep breathing, or making time for fun activities.  Alcohol use Do not drink alcohol if: Your health care provider tells you not to drink. You are pregnant, may be pregnant, or are planning to become pregnant. If you drink alcohol: Limit how much you use  to: 0-1 drink a day for women. 0-2 drinks a day for men. Be aware of how much alcohol is in your drink. In the [place] one drink equals one 12 oz bottle of beer (355 mL), one 5 oz glass of wine (148 mL), or one 1 oz glass of hard liquor (44 mL). Medicines Your health care provider may prescribe medicine if lifestyle changes are not enough to get your blood pressure under control and if: Your systolic blood pressure is 130 or higher. Your diastolic blood pressure is 80 or higher. Take medicines only as told by your health care provider. Follow the directions carefully. Blood pressure medicines must be taken as told by your health care provider. The medicine does not work as well when you skip doses. Skippingdoses also puts you at risk for problems. Monitoring Before you monitor your blood pressure: Do not smoke, drink caffeinated beverages, or exercise within 30 minutes before taking a measurement. Use the bathroom and empty your bladder (urinate). Sit quietly for at least 5 minutes before taking measurements. Monitor your blood pressure at home as told by your health  care provider. To do this: Sit with your back straight and supported. Place your feet flat on the floor. Do not cross your legs. Support your arm on a flat surface, such as a table. Make sure your upper arm is at heart level. Each time you measure, take two or three readings one minute apart and record the results. You may also need to have your blood pressure checked regularly by your healthcare provider. General information Talk with your health care provider about your diet, exercise habits, and other lifestyle factors that may be contributing to hypertension. Review all the medicines you take with your health care provider because there may be side effects or interactions. Keep all visits as told by your health care provider. Your health care provider can help you create and adjust your plan for managing your high blood  pressure. Where to find more information National Heart, Lung, and Blood Institute: https://wilson-eaton.com/ American Heart Association: www.heart.org Contact a health care provider if: You think you are having a reaction to medicines you have taken. You have repeated (recurrent) headaches. You feel dizzy. You have swelling in your ankles. You have trouble with your vision. Get help right away if: You develop a severe headache or confusion. You have unusual weakness or numbness, or you feel faint. You have severe pain in your chest or abdomen. You vomit repeatedly. You have trouble breathing. These symptoms may represent a serious problem that is an emergency. Do not wait to see if the symptoms will go away. Get medical help right away. Call your local emergency services (911 in the [place] Do not drive yourself to the hospital. Summary Hypertension is when the force of blood pumping through your arteries is too strong. If this condition is not controlled, it may put you at risk for serious complications. Your personal target blood pressure may vary depending on your medical conditions, your age, and other factors. For most people, a normal blood pressure is less than 120/80. Hypertension is managed by lifestyle changes, medicines, or both. Lifestyle changes to help manage hypertension include losing weight, eating a healthy, low-sodium diet, exercising more, stopping smoking, and limiting alcohol. This information is not intended to replace advice given to you by your health care provider. Make sure you discuss any questions you have with your healthcare provider. Document Revised: 08/22/2019 Document Reviewed: 06/17/2019 Elsevier Patient Education  [place].  [email or web address]">  DASH Eating Plan DASH stands for Dietary Approaches to Stop Hypertension. The DASH eating plan is a healthy eating plan that has been shown to: Reduce high  blood pressure (hypertension). Reduce your risk for type 2 diabetes, heart disease, and stroke. Help with weight loss. What are tips for following this plan? Reading food labels Check food labels for the amount of salt (sodium) per serving. Choose foods with less than 5 percent of the Daily Value of sodium. Generally, foods with less than 300 milligrams (mg) of sodium per serving fit into this eating plan. To find whole grains, look for the word "whole" as the first word in the ingredient list. Shopping Buy products labeled as "low-sodium" or "no salt added." Buy fresh foods. Avoid canned foods and pre-made or frozen meals. Cooking Avoid adding salt when cooking. Use salt-free seasonings or herbs instead of table salt or sea salt. Check with your health care provider or pharmacist before using salt substitutes. Do not fry foods. Cook foods using healthy methods such as baking, boiling, grilling, roasting, and broiling instead. Cook with heart-healthy oils,  such as olive, canola, avocado, soybean, or sunflower oil. Meal planning  Eat a balanced diet that includes: 4 or more servings of fruits and 4 or more servings of vegetables each day. Try to fill one-half of your plate with fruits and vegetables. 6-8 servings of whole grains each day. Less than 6 oz (170 g) of lean meat, poultry, or fish each day. A 3-oz (85-g) serving of meat is about the same size as a deck of cards. One egg equals 1 oz (28 g). 2-3 servings of low-fat dairy each day. One serving is 1 cup (237 mL). 1 serving of nuts, seeds, or beans 5 times each week. 2-3 servings of heart-healthy fats. Healthy fats called omega-3 fatty acids are found in foods such as walnuts, flaxseeds, fortified milks, and eggs. These fats are also found in cold-water fish, such as sardines, salmon, and mackerel. Limit how much you eat of: Canned or prepackaged foods. Food that is high in trans fat, such as some fried foods. Food that is high in  saturated fat, such as fatty meat. Desserts and other sweets, sugary drinks, and other foods with added sugar. Full-fat dairy products. Do not salt foods before eating. Do not eat more than 4 egg yolks a week. Try to eat at least 2 vegetarian meals a week. Eat more home-cooked food and less restaurant, buffet, and fast food.  Lifestyle When eating at a restaurant, ask that your food be prepared with less salt or no salt, if possible. If you drink alcohol: Limit how much you use to: 0-1 drink a day for women who are not pregnant. 0-2 drinks a day for men. Be aware of how much alcohol is in your drink. In the [place] one drink equals one 12 oz bottle of beer (355 mL), one 5 oz glass of wine (148 mL), or one 1 oz glass of hard liquor (44 mL). General information Avoid eating more than 2,300 mg of salt a day. If you have hypertension, you may need to reduce your sodium intake to 1,500 mg a day. Work with your health care provider to maintain a healthy body weight or to lose weight. Ask what an ideal weight is for you. Get at least 30 minutes of exercise that causes your heart to beat faster (aerobic exercise) most days of the week. Activities may include walking, swimming, or biking. Work with your health care provider or dietitian to adjust your eating plan to your individual calorie needs. What foods should I eat? Fruits All fresh, dried, or frozen fruit. Canned fruit in natural juice (without addedsugar). Vegetables Fresh or frozen vegetables (raw, steamed, roasted, or grilled). Low-sodium or reduced-sodium tomato and vegetable juice. Low-sodium or reduced-sodium tomatosauce and tomato paste. Low-sodium or reduced-sodium canned vegetables. Grains Whole-grain or whole-wheat bread. Whole-grain or whole-wheat pasta. Brown rice. [place]. Bulgur. Whole-grain and low-sodium cereals. Pita bread.Low-fat, low-sodium crackers. Whole-wheat flour tortillas. Meats and other proteins Skinless  chicken or [place]. Ground chicken or [place]. Pork with fat trimmed off. Fish and seafood. Egg whites. Dried beans, peas, or lentils. Unsalted nuts, nut butters, and seeds. Unsalted canned beans. Lean cuts of beef with fat trimmed off. Low-sodium, lean precooked or cured meat, such as sausages or meatloaves. Dairy Low-fat (1%) or fat-free (skim) milk. Reduced-fat, low-fat, or fat-free cheeses. Nonfat, low-sodium ricotta or cottage cheese. Low-fat or nonfatyogurt. Low-fat, low-sodium cheese. Fats and oils Soft margarine without trans fats. Vegetable oil. Reduced-fat, low-fat, or light mayonnaise and salad dressings (reduced-sodium). Canola, safflower, olive, avocado,  soybean, andsunflower oils. Avocado. Seasonings and condiments Herbs. Spices. Seasoning mixes without salt. Other foods Unsalted popcorn and pretzels. Fat-free sweets. The items listed above may not be a complete list of foods and beverages you can eat. Contact a dietitian for more information. What foods should I avoid? Fruits Canned fruit in a light or heavy syrup. Fried fruit. Fruit in cream or buttersauce. Vegetables Creamed or fried vegetables. Vegetables in a cheese sauce. Regular canned vegetables (not low-sodium or reduced-sodium). Regular canned tomato sauce and paste (not low-sodium or reduced-sodium). Regular tomato and vegetable juice(not low-sodium or reduced-sodium). Angie Fava. Olives. Grains Baked goods made with fat, such as croissants, muffins, or some breads. Drypasta or rice meal packs. Meats and other proteins Fatty cuts of meat. Ribs. Fried meat. Berniece Salines. Bologna, salami, and other precooked or cured meats, such as sausages or meat loaves. Fat from the back of a pig (fatback). Bratwurst. Salted nuts and seeds. Canned beans with added salt. Canned orsmoked fish. Whole eggs or egg yolks. Chicken or [place] with skin. Dairy Whole or 2% milk, cream, and half-and-half. Whole or full-fat cream cheese. Whole-fat or sweetened  yogurt. Full-fat cheese. Nondairy creamers. Whippedtoppings. Processed cheese and cheese spreads. Fats and oils Butter. Stick margarine. Lard. Shortening. Ghee. Bacon fat. Tropical oils, suchas coconut, palm kernel, or palm oil. Seasonings and condiments Onion salt, garlic salt, seasoned salt, table salt, and sea salt. Worcestershire sauce. Tartar sauce. Barbecue sauce. Teriyaki sauce. Soy sauce, including reduced-sodium. Steak sauce. Canned and packaged gravies. Fish sauce. Oyster sauce. Cocktail sauce. Store-bought horseradish. Ketchup. Mustard. Meat flavorings and tenderizers. Bouillon cubes. Hot sauces. Pre-made or packaged marinades. Pre-made or packaged taco seasonings. Relishes. Regular saladdressings. Other foods Salted popcorn and pretzels. The items listed above may not be a complete list of foods and beverages you should avoid. Contact a dietitian for more information. Where to find more information National Heart, Lung, and Blood Institute: https://wilson-eaton.com/ American Heart Association: www.heart.org Academy of Nutrition and Dietetics: www.eatright.[place]: www.kidney.org Summary The DASH eating plan is a healthy eating plan that has been shown to reduce high blood pressure (hypertension). It may also reduce your risk for type 2 diabetes, heart disease, and stroke. When on the DASH eating plan, aim to eat more fresh fruits and vegetables, whole grains, lean proteins, low-fat dairy, and heart-healthy fats. With the DASH eating plan, you should limit salt (sodium) intake to 2,300 mg a day. If you have hypertension, you may need to reduce your sodium intake to 1,500 mg a day. Work with your health care provider or dietitian to adjust your eating plan to your individual calorie needs. This information is not intended to replace advice given to you by your health care provider. Make sure you discuss any questions you have with your healthcare provider. Document  Revised: 06/20/2019 Document Reviewed: 06/20/2019 Elsevier Patient Education  2022 [place]. Insomnia Insomnia is a sleep disorder that makes it difficult to fall asleep or stay asleep. Insomnia can cause fatigue, low energy, difficulty concentrating, moodswings, and poor performance at work or school. There are three different ways to classify insomnia: Difficulty falling asleep. Difficulty staying asleep. Waking up too early in the morning. Any type of insomnia can be long-term (chronic) or short-term (acute). Both are common. Short-term insomnia usually lasts for three months or less. Chronic insomnia occurs at least three times a week for longer than threemonths. What are the causes? Insomnia may be caused by another condition, situation, or substance, such as: Anxiety. Certain medicines.  Gastroesophageal reflux disease (GERD) or other gastrointestinal conditions. Asthma or other breathing conditions. Restless legs syndrome, sleep apnea, or other sleep disorders. Chronic pain. Menopause. Stroke. Abuse of alcohol, tobacco, or illegal drugs. Mental health conditions, such as depression. Caffeine. Neurological disorders, such as Alzheimer's disease. An overactive thyroid (hyperthyroidism). Sometimes, the cause of insomnia may not be known. What increases the risk? Risk factors for insomnia include: Gender. Women are affected more often than men. Age. Insomnia is more common as you get older. Stress. Lack of exercise. Irregular work schedule or working night shifts. Traveling between different time zones. Certain medical and mental health conditions. What are the signs or symptoms? If you have insomnia, the main symptom is having trouble falling asleep or having trouble staying asleep. This may lead to other symptoms, such as: Feeling fatigued or having low energy. Feeling nervous about going to sleep. Not feeling rested in the morning. Having trouble  concentrating. Feeling irritable, anxious, or depressed. How is this diagnosed? This condition may be diagnosed based on: Your symptoms and medical history. Your health care provider may ask about: Your sleep habits. Any medical conditions you have. Your mental health. A physical exam. How is this treated? Treatment for insomnia depends on the cause. Treatment may focus on treating an underlying condition that is causing insomnia. Treatment may also include: Medicines to help you sleep. Counseling or therapy. Lifestyle adjustments to help you sleep better. Follow these instructions at home: Eating and drinking  Limit or avoid alcohol, caffeinated beverages, and cigarettes, especially close to bedtime. These can disrupt your sleep. Do not eat a large meal or eat spicy foods right before bedtime. This can lead to digestive discomfort that can make it hard for you to sleep.  Sleep habits  Keep a sleep diary to help you and your health care provider figure out what could be causing your insomnia. Write down: When you sleep. When you wake up during the night. How well you sleep. How rested you feel the next day. Any side effects of medicines you are taking. What you eat and drink. Make your bedroom a dark, comfortable place where it is easy to fall asleep. Put up shades or blackout curtains to block light from outside. Use a white noise machine to block noise. Keep the temperature cool. Limit screen use before bedtime. This includes: Watching TV. Using your smartphone, tablet, or computer. Stick to a routine that includes going to bed and waking up at the same times every day and night. This can help you fall asleep faster. Consider making a quiet activity, such as reading, part of your nighttime routine. Try to avoid taking naps during the day so that you sleep better at night. Get out of bed if you are still awake after 15 minutes of trying to sleep. Keep the lights down, but try  reading or doing a quiet activity. When you feel sleepy, go back to bed.  General instructions Take over-the-counter and prescription medicines only as told by your health care provider. Exercise regularly, as told by your health care provider. Avoid exercise starting several hours before bedtime. Use relaxation techniques to manage stress. Ask your health care provider to suggest some techniques that may work well for you. These may include: Breathing exercises. Routines to release muscle tension. Visualizing peaceful scenes. Make sure that you drive carefully. Avoid driving if you feel very sleepy. Keep all follow-up visits as told by your health care provider. This is important. Contact a health care provider  if: You are tired throughout the day. You have trouble in your daily routine due to sleepiness. You continue to have sleep problems, or your sleep problems get worse. Get help right away if: You have serious thoughts about hurting yourself or someone else. If you ever feel like you may hurt yourself or others, or have thoughts about taking your own life, get help right away. You can go to your nearest emergency department or call: Your local emergency services (911 in the [place] A suicide crisis helpline, such as the [facility] at (310)513-2010. This is open 24 hours a day. Summary Insomnia is a sleep disorder that makes it difficult to fall asleep or stay asleep. Insomnia can be long-term (chronic) or short-term (acute). Treatment for insomnia depends on the cause. Treatment may focus on treating an underlying condition that is causing insomnia. Keep a sleep diary to help you and your health care provider figure out what could be causing your insomnia. This information is not intended to replace advice given to you by your health care provider. Make sure you discuss any questions you have with your healthcare provider. Document Revised: 05/27/2020  Document Reviewed: 05/27/2020 [employer] Patient Education  2022 [employer].

## 2021-03-17 NOTE — Progress Notes (Signed)
Subjective:    Patient here for follow-up of elevated blood pressure.  He is exercising and is adherent to a low-salt diet.  Blood pressure is well controlled at home. Cardiac symptoms: none. Patient denies: chest pain, fatigue, and palpitations. Cardiovascular risk factors: hypertension, male gender, and smoking/ tobacco exposure. Use of agents associated with hypertension: none. History of target organ damage: none.  Insomnia: has tried prescription sleep aides in the past, but could not tolerate. Will place referral for sleep eval. Patient states he is only sleeping 2 hours per night.   Review of Systems Review of Systems  Constitutional: Negative.   HENT: Negative.    Eyes: Negative.   Respiratory: Negative.    Cardiovascular: Negative.   Gastrointestinal: Negative.   Genitourinary: Negative.   Musculoskeletal: Negative.   Skin: Negative.   Neurological: Negative.   Endo/Heme/Allergies: Negative.   Psychiatric/Behavioral:  The patient has insomnia.        Objective:    Physical Exam Constitutional:      General: He is not in acute distress. Cardiovascular:     Rate and Rhythm: Normal rate and regular rhythm.  Pulmonary:     Effort: Pulmonary effort is normal.     Breath sounds: Normal breath sounds.  Skin:    General: Skin is warm and dry.  Neurological:     Mental Status: He is alert and oriented to person, place, and time.  Psychiatric:        Mood and Affect: Mood and affect normal.        Behavior: Behavior normal.      Assessment:    Hypertension  Insomnia    Plan:    Dietary sodium restriction. Regular aerobic exercise. Check blood pressures monthly and record. Follow up: 3 months and as needed.  Patient Instructions   Hypertension:  Continue current medications  Stay active  Low sodium diet  Insomnia:  Referral placed to sleep / pulmonary  Melatonin 1 mg + Magnesium 100 - 300 mg 1 hour before bedtime   Follow up:  Follow up in 4  months for CPE with fasting labs   Hypertension, Adult Hypertension is another name for high blood pressure. High blood pressure forces your heart to work harder to pump blood. This can cause problems overtime. There are two numbers in a blood pressure reading. There is a top number (systolic) over a bottom number (diastolic). It is best to have a blood pressure that is below 120/80. Healthy choicescan help lower your blood pressure, or you may need medicine to help lower it. What are the causes? The cause of this condition is not known. Some conditions may be related tohigh blood pressure. What increases the risk? Smoking. Having type 2 diabetes mellitus, high cholesterol, or both. Not getting enough exercise or physical activity. Being overweight. Having too much fat, sugar, calories, or salt (sodium) in your diet. Drinking too much alcohol. Having long-term (chronic) kidney disease. Having a family history of high blood pressure. Age. Risk increases with age. Race. You may be at higher risk if you are African American. Gender. Men are at higher risk than women before age 77. After age 74, women are at higher risk than men. Having obstructive sleep apnea. Stress. What are the signs or symptoms? High blood pressure may not cause symptoms. Very high blood pressure (hypertensive crisis) may cause: Headache. Feelings of worry or nervousness (anxiety). Shortness of breath. Nosebleed. A feeling of being sick to your stomach (nausea). Throwing up (vomiting). Changes  in how you see. Very bad chest pain. Seizures. How is this treated? This condition is treated by making healthy lifestyle changes, such as: Eating healthy foods. Exercising more. Drinking less alcohol. Your health care provider may prescribe medicine if lifestyle changes are not enough to get your blood pressure under control, and if: Your top number is above 130. Your bottom number is above 80. Your personal target  blood pressure may vary. Follow these instructions at home: Eating and drinking  If told, follow the DASH eating plan. To follow this plan: Fill one half of your plate at each meal with fruits and vegetables. Fill one fourth of your plate at each meal with whole grains. Whole grains include whole-wheat pasta, brown rice, and whole-grain bread. Eat or drink low-fat dairy products, such as skim milk or low-fat yogurt. Fill one fourth of your plate at each meal with low-fat (lean) proteins. Low-fat proteins include fish, chicken without skin, eggs, beans, and tofu. Avoid fatty meat, cured and processed meat, or chicken with skin. Avoid pre-made or processed food. Eat less than 1,500 mg of salt each day. Do not drink alcohol if: Your doctor tells you not to drink. You are pregnant, may be pregnant, or are planning to become pregnant. If you drink alcohol: Limit how much you use to: 0-1 drink a day for women. 0-2 drinks a day for men. Be aware of how much alcohol is in your drink. In the [place] one drink equals one 12 oz bottle of beer (355 mL), one 5 oz glass of wine (148 mL), or one 1 oz glass of hard liquor (44 mL).  Lifestyle  Work with your doctor to stay at a healthy weight or to lose weight. Ask your doctor what the best weight is for you. Get at least 30 minutes of exercise most days of the week. This may include walking, swimming, or biking. Get at least 30 minutes of exercise that strengthens your muscles (resistance exercise) at least 3 days a week. This may include lifting weights or doing Pilates. Do not use any products that contain nicotine or tobacco, such as cigarettes, e-cigarettes, and chewing tobacco. If you need help quitting, ask your doctor. Check your blood pressure at home as told by your doctor. Keep all follow-up visits as told by your doctor. This is important.  Medicines Take over-the-counter and prescription medicines only as told by your doctor. Follow  directions carefully. Do not skip doses of blood pressure medicine. The medicine does not work as well if you skip doses. Skipping doses also puts you at risk for problems. Ask your doctor about side effects or reactions to medicines that you should watch for. Contact a doctor if you: Think you are having a reaction to the medicine you are taking. Have headaches that keep coming back (recurring). Feel dizzy. Have swelling in your ankles. Have trouble with your vision. Get help right away if you: Get a very bad headache. Start to feel mixed up (confused). Feel weak or numb. Feel faint. Have very bad pain in your: Chest. Belly (abdomen). Throw up more than once. Have trouble breathing. Summary Hypertension is another name for high blood pressure. High blood pressure forces your heart to work harder to pump blood. For most people, a normal blood pressure is less than 120/80. Making healthy choices can help lower blood pressure. If your blood pressure does not get lower with healthy choices, you may need to take medicine. This information is not intended to replace  advice given to you by your health care provider. Make sure you discuss any questions you have with your healthcare provider. Document Revised: 03/27/2018 Document Reviewed: 03/27/2018 Elsevier Patient Education  2022 [place] Your Hypertension Hypertension, also called high blood pressure, is when the force of the blood pressing against the walls of the arteries is too strong. Arteries are blood vessels that carry blood from your heart throughout your body. Hypertension forces the heart to work harder to pump blood and may cause the arteries tobecome narrow or stiff. Understanding blood pressure readings Your personal target blood pressure may vary depending on your medical conditions, your age, and other factors. A blood pressure reading includes a higher number over a lower number. Ideally, your blood pressure  should be below 120/80. You should know that: The first, or top, number is called the systolic pressure. It is a measure of the pressure in your arteries as your heart beats. The second, or bottom number, is called the diastolic pressure. It is a measure of the pressure in your arteries as the heart relaxes. Blood pressure is classified into four stages. Based on your blood pressure reading, your health care provider may use the following stages to determine what type of treatment you need, if any. Systolic pressure and diastolicpressure are measured in a unit called mmHg. Normal Systolic pressure: below 123456. Diastolic pressure: below 80. Elevated Systolic pressure: [id number]. Diastolic pressure: below 80. Hypertension stage 1 Systolic pressure: 0000000. Diastolic pressure: [id number]. Hypertension stage 2 Systolic pressure: [id number] or above. Diastolic pressure: 90 or above. How can this condition affect me? Managing your hypertension is an important responsibility. Over time, hypertension can damage the arteries and decrease blood flow to important parts of the body, including the brain, heart, and kidneys. Having untreated or uncontrolled hypertension can lead to: A heart attack. A stroke. A weakened blood vessel (aneurysm). Heart failure. Kidney damage. Eye damage. Metabolic syndrome. Memory and concentration problems. Vascular dementia. What actions can I take to manage this condition? Hypertension can be managed by making lifestyle changes and possibly by taking medicines. Your health care provider will help you make a plan to bring yourblood pressure within a normal range. Nutrition  Eat a diet that is high in fiber and potassium, and low in salt (sodium), added sugar, and fat. An example eating plan is called the Dietary Approaches to Stop Hypertension (DASH) diet. To eat this way: Eat plenty of fresh fruits and vegetables. Try to fill one-half of your plate at each meal with fruits and  vegetables. Eat whole grains, such as whole-wheat pasta, brown rice, or whole-grain bread. Fill about one-fourth of your plate with whole grains. Eat low-fat dairy products. Avoid fatty cuts of meat, processed or cured meats, and poultry with skin. Fill about one-fourth of your plate with lean proteins such as fish, chicken without skin, beans, eggs, and tofu. Avoid pre-made and processed foods. These tend to be higher in sodium, added sugar, and fat. Reduce your daily sodium intake. Most people with hypertension should eat less than 1,500 mg of sodium a day.  Lifestyle  Work with your health care provider to maintain a healthy body weight or to lose weight. Ask what an ideal weight is for you. Get at least 30 minutes of exercise that causes your heart to beat faster (aerobic exercise) most days of the week. Activities may include walking, swimming, or biking. Include exercise to strengthen your muscles (resistance exercise), such as weight lifting, as part of  your weekly exercise routine. Try to do these types of exercises for 30 minutes at least 3 days a week. Do not use any products that contain nicotine or tobacco, such as cigarettes, e-cigarettes, and chewing tobacco. If you need help quitting, ask your health care provider. Control any long-term (chronic) conditions you have, such as high cholesterol or diabetes. Identify your sources of stress and find ways to manage stress. This may include meditation, deep breathing, or making time for fun activities.  Alcohol use Do not drink alcohol if: Your health care provider tells you not to drink. You are pregnant, may be pregnant, or are planning to become pregnant. If you drink alcohol: Limit how much you use to: 0-1 drink a day for women. 0-2 drinks a day for men. Be aware of how much alcohol is in your drink. In the [place] one drink equals one 12 oz bottle of beer (355 mL), one 5 oz glass of wine (148 mL), or one 1 oz glass of hard liquor  (44 mL). Medicines Your health care provider may prescribe medicine if lifestyle changes are not enough to get your blood pressure under control and if: Your systolic blood pressure is 130 or higher. Your diastolic blood pressure is 80 or higher. Take medicines only as told by your health care provider. Follow the directions carefully. Blood pressure medicines must be taken as told by your health care provider. The medicine does not work as well when you skip doses. Skippingdoses also puts you at risk for problems. Monitoring Before you monitor your blood pressure: Do not smoke, drink caffeinated beverages, or exercise within 30 minutes before taking a measurement. Use the bathroom and empty your bladder (urinate). Sit quietly for at least 5 minutes before taking measurements. Monitor your blood pressure at home as told by your health care provider. To do this: Sit with your back straight and supported. Place your feet flat on the floor. Do not cross your legs. Support your arm on a flat surface, such as a table. Make sure your upper arm is at heart level. Each time you measure, take two or three readings one minute apart and record the results. You may also need to have your blood pressure checked regularly by your healthcare provider. General information Talk with your health care provider about your diet, exercise habits, and other lifestyle factors that may be contributing to hypertension. Review all the medicines you take with your health care provider because there may be side effects or interactions. Keep all visits as told by your health care provider. Your health care provider can help you create and adjust your plan for managing your high blood pressure. Where to find more information National Heart, Lung, and Blood Institute: https://wilson-eaton.com/ American Heart Association: www.heart.org Contact a health care provider if: You think you are having a reaction to medicines you have  taken. You have repeated (recurrent) headaches. You feel dizzy. You have swelling in your ankles. You have trouble with your vision. Get help right away if: You develop a severe headache or confusion. You have unusual weakness or numbness, or you feel faint. You have severe pain in your chest or abdomen. You vomit repeatedly. You have trouble breathing. These symptoms may represent a serious problem that is an emergency. Do not wait to see if the symptoms will go away. Get medical help right away. Call your local emergency services (911 in the [place] Do not drive yourself to the hospital. Summary Hypertension is when the force  of blood pumping through your arteries is too strong. If this condition is not controlled, it may put you at risk for serious complications. Your personal target blood pressure may vary depending on your medical conditions, your age, and other factors. For most people, a normal blood pressure is less than 120/80. Hypertension is managed by lifestyle changes, medicines, or both. Lifestyle changes to help manage hypertension include losing weight, eating a healthy, low-sodium diet, exercising more, stopping smoking, and limiting alcohol. This information is not intended to replace advice given to you by your health care provider. Make sure you discuss any questions you have with your healthcare provider. Document Revised: 08/22/2019 Document Reviewed: 06/17/2019 Elsevier Patient Education  [email or web address].  [email or web address]">  DASH Eating Plan DASH stands for Dietary Approaches to Stop Hypertension. The DASH eating plan is a healthy eating plan that has been shown to: Reduce high blood pressure (hypertension). Reduce your risk for type 2 diabetes, heart disease, and stroke. Help with weight loss. What are tips for following this plan? Reading food labels Check food labels for the amount of salt (sodium) per serving.  Choose foods with less than 5 percent of the Daily Value of sodium. Generally, foods with less than 300 milligrams (mg) of sodium per serving fit into this eating plan. To find whole grains, look for the word "whole" as the first word in the ingredient list. Shopping Buy products labeled as "low-sodium" or "no salt added." Buy fresh foods. Avoid canned foods and pre-made or frozen meals. Cooking Avoid adding salt when cooking. Use salt-free seasonings or herbs instead of table salt or sea salt. Check with your health care provider or pharmacist before using salt substitutes. Do not fry foods. Cook foods using healthy methods such as baking, boiling, grilling, roasting, and broiling instead. Cook with heart-healthy oils, such as olive, canola, avocado, soybean, or sunflower oil. Meal planning  Eat a balanced diet that includes: 4 or more servings of fruits and 4 or more servings of vegetables each day. Try to fill one-half of your plate with fruits and vegetables. 6-8 servings of whole grains each day. Less than 6 oz (170 g) of lean meat, poultry, or fish each day. A 3-oz (85-g) serving of meat is about the same size as a deck of cards. One egg equals 1 oz (28 g). 2-3 servings of low-fat dairy each day. One serving is 1 cup (237 mL). 1 serving of nuts, seeds, or beans 5 times each week. 2-3 servings of heart-healthy fats. Healthy fats called omega-3 fatty acids are found in foods such as walnuts, flaxseeds, fortified milks, and eggs. These fats are also found in cold-water fish, such as sardines, salmon, and mackerel. Limit how much you eat of: Canned or prepackaged foods. Food that is high in trans fat, such as some fried foods. Food that is high in saturated fat, such as fatty meat. Desserts and other sweets, sugary drinks, and other foods with added sugar. Full-fat dairy products. Do not salt foods before eating. Do not eat more than 4 egg yolks a week. Try to eat at least 2 vegetarian  meals a week. Eat more home-cooked food and less restaurant, buffet, and fast food.  Lifestyle When eating at a restaurant, ask that your food be prepared with less salt or no salt, if possible. If you drink alcohol: Limit how much you use to: 0-1 drink a day for women who are not pregnant. 0-2 drinks a day for men. Be aware of how  much alcohol is in your drink. In the [place] one drink equals one 12 oz bottle of beer (355 mL), one 5 oz glass of wine (148 mL), or one 1 oz glass of hard liquor (44 mL). General information Avoid eating more than 2,300 mg of salt a day. If you have hypertension, you may need to reduce your sodium intake to 1,500 mg a day. Work with your health care provider to maintain a healthy body weight or to lose weight. Ask what an ideal weight is for you. Get at least 30 minutes of exercise that causes your heart to beat faster (aerobic exercise) most days of the week. Activities may include walking, swimming, or biking. Work with your health care provider or dietitian to adjust your eating plan to your individual calorie needs. What foods should I eat? Fruits All fresh, dried, or frozen fruit. Canned fruit in natural juice (without addedsugar). Vegetables Fresh or frozen vegetables (raw, steamed, roasted, or grilled). Low-sodium or reduced-sodium tomato and vegetable juice. Low-sodium or reduced-sodium tomatosauce and tomato paste. Low-sodium or reduced-sodium canned vegetables. Grains Whole-grain or whole-wheat bread. Whole-grain or whole-wheat pasta. Brown rice. [place]. Bulgur. Whole-grain and low-sodium cereals. Pita bread.Low-fat, low-sodium crackers. Whole-wheat flour tortillas. Meats and other proteins Skinless chicken or [place]. Ground chicken or [place]. Pork with fat trimmed off. Fish and seafood. Egg whites. Dried beans, peas, or lentils. Unsalted nuts, nut butters, and seeds. Unsalted canned beans. Lean cuts of beef with fat trimmed off. Low-sodium, lean  precooked or cured meat, such as sausages or meatloaves. Dairy Low-fat (1%) or fat-free (skim) milk. Reduced-fat, low-fat, or fat-free cheeses. Nonfat, low-sodium ricotta or cottage cheese. Low-fat or nonfatyogurt. Low-fat, low-sodium cheese. Fats and oils Soft margarine without trans fats. Vegetable oil. Reduced-fat, low-fat, or light mayonnaise and salad dressings (reduced-sodium). Canola, safflower, olive, avocado, soybean, andsunflower oils. Avocado. Seasonings and condiments Herbs. Spices. Seasoning mixes without salt. Other foods Unsalted popcorn and pretzels. Fat-free sweets. The items listed above may not be a complete list of foods and beverages you can eat. Contact a dietitian for more information. What foods should I avoid? Fruits Canned fruit in a light or heavy syrup. Fried fruit. Fruit in cream or buttersauce. Vegetables Creamed or fried vegetables. Vegetables in a cheese sauce. Regular canned vegetables (not low-sodium or reduced-sodium). Regular canned tomato sauce and paste (not low-sodium or reduced-sodium). Regular tomato and vegetable juice(not low-sodium or reduced-sodium). Angie Fava. Olives. Grains Baked goods made with fat, such as croissants, muffins, or some breads. Drypasta or rice meal packs. Meats and other proteins Fatty cuts of meat. Ribs. Fried meat. Berniece Salines. Bologna, salami, and other precooked or cured meats, such as sausages or meat loaves. Fat from the back of a pig (fatback). Bratwurst. Salted nuts and seeds. Canned beans with added salt. Canned orsmoked fish. Whole eggs or egg yolks. Chicken or [place] with skin. Dairy Whole or 2% milk, cream, and half-and-half. Whole or full-fat cream cheese. Whole-fat or sweetened yogurt. Full-fat cheese. Nondairy creamers. Whippedtoppings. Processed cheese and cheese spreads. Fats and oils Butter. Stick margarine. Lard. Shortening. Ghee. Bacon fat. Tropical oils, suchas coconut, palm kernel, or palm oil. Seasonings and  condiments Onion salt, garlic salt, seasoned salt, table salt, and sea salt. Worcestershire sauce. Tartar sauce. Barbecue sauce. Teriyaki sauce. Soy sauce, including reduced-sodium. Steak sauce. Canned and packaged gravies. Fish sauce. Oyster sauce. Cocktail sauce. Store-bought horseradish. Ketchup. Mustard. Meat flavorings and tenderizers. Bouillon cubes. Hot sauces. Pre-made or packaged marinades. Pre-made or packaged taco seasonings. Relishes. Regular saladdressings.  Other foods Salted popcorn and pretzels. The items listed above may not be a complete list of foods and beverages you should avoid. Contact a dietitian for more information. Where to find more information National Heart, Lung, and Blood Institute: https://wilson-eaton.com/ American Heart Association: www.heart.org Academy of Nutrition and Dietetics: www.eatright.[place]: www.kidney.org Summary The DASH eating plan is a healthy eating plan that has been shown to reduce high blood pressure (hypertension). It may also reduce your risk for type 2 diabetes, heart disease, and stroke. When on the DASH eating plan, aim to eat more fresh fruits and vegetables, whole grains, lean proteins, low-fat dairy, and heart-healthy fats. With the DASH eating plan, you should limit salt (sodium) intake to 2,300 mg a day. If you have hypertension, you may need to reduce your sodium intake to 1,500 mg a day. Work with your health care provider or dietitian to adjust your eating plan to your individual calorie needs. This information is not intended to replace advice given to you by your health care provider. Make sure you discuss any questions you have with your healthcare provider. Document Revised: 06/20/2019 Document Reviewed: 06/20/2019 Elsevier Patient Education  2022 [place]. Insomnia Insomnia is a sleep disorder that makes it difficult to fall asleep or stay asleep. Insomnia can cause fatigue, low energy, difficulty  concentrating, moodswings, and poor performance at work or school. There are three different ways to classify insomnia: Difficulty falling asleep. Difficulty staying asleep. Waking up too early in the morning. Any type of insomnia can be long-term (chronic) or short-term (acute). Both are common. Short-term insomnia usually lasts for three months or less. Chronic insomnia occurs at least three times a week for longer than threemonths. What are the causes? Insomnia may be caused by another condition, situation, or substance, such as: Anxiety. Certain medicines. Gastroesophageal reflux disease (GERD) or other gastrointestinal conditions. Asthma or other breathing conditions. Restless legs syndrome, sleep apnea, or other sleep disorders. Chronic pain. Menopause. Stroke. Abuse of alcohol, tobacco, or illegal drugs. Mental health conditions, such as depression. Caffeine. Neurological disorders, such as Alzheimer's disease. An overactive thyroid (hyperthyroidism). Sometimes, the cause of insomnia may not be known. What increases the risk? Risk factors for insomnia include: Gender. Women are affected more often than men. Age. Insomnia is more common as you get older. Stress. Lack of exercise. Irregular work schedule or working night shifts. Traveling between different time zones. Certain medical and mental health conditions. What are the signs or symptoms? If you have insomnia, the main symptom is having trouble falling asleep or having trouble staying asleep. This may lead to other symptoms, such as: Feeling fatigued or having low energy. Feeling nervous about going to sleep. Not feeling rested in the morning. Having trouble concentrating. Feeling irritable, anxious, or depressed. How is this diagnosed? This condition may be diagnosed based on: Your symptoms and medical history. Your health care provider may ask about: Your sleep habits. Any medical conditions you have. Your  mental health. A physical exam. How is this treated? Treatment for insomnia depends on the cause. Treatment may focus on treating an underlying condition that is causing insomnia. Treatment may also include: Medicines to help you sleep. Counseling or therapy. Lifestyle adjustments to help you sleep better. Follow these instructions at home: Eating and drinking  Limit or avoid alcohol, caffeinated beverages, and cigarettes, especially close to bedtime. These can disrupt your sleep. Do not eat a large meal or eat spicy foods right before bedtime. This can lead to  digestive discomfort that can make it hard for you to sleep.  Sleep habits  Keep a sleep diary to help you and your health care provider figure out what could be causing your insomnia. Write down: When you sleep. When you wake up during the night. How well you sleep. How rested you feel the next day. Any side effects of medicines you are taking. What you eat and drink. Make your bedroom a dark, comfortable place where it is easy to fall asleep. Put up shades or blackout curtains to block light from outside. Use a white noise machine to block noise. Keep the temperature cool. Limit screen use before bedtime. This includes: Watching TV. Using your smartphone, tablet, or computer. Stick to a routine that includes going to bed and waking up at the same times every day and night. This can help you fall asleep faster. Consider making a quiet activity, such as reading, part of your nighttime routine. Try to avoid taking naps during the day so that you sleep better at night. Get out of bed if you are still awake after 15 minutes of trying to sleep. Keep the lights down, but try reading or doing a quiet activity. When you feel sleepy, go back to bed.  General instructions Take over-the-counter and prescription medicines only as told by your health care provider. Exercise regularly, as told by your health care provider. Avoid exercise  starting several hours before bedtime. Use relaxation techniques to manage stress. Ask your health care provider to suggest some techniques that may work well for you. These may include: Breathing exercises. Routines to release muscle tension. Visualizing peaceful scenes. Make sure that you drive carefully. Avoid driving if you feel very sleepy. Keep all follow-up visits as told by your health care provider. This is important. Contact a health care provider if: You are tired throughout the day. You have trouble in your daily routine due to sleepiness. You continue to have sleep problems, or your sleep problems get worse. Get help right away if: You have serious thoughts about hurting yourself or someone else. If you ever feel like you may hurt yourself or others, or have thoughts about taking your own life, get help right away. You can go to your nearest emergency department or call: Your local emergency services (911 in the [place] A suicide crisis helpline, such as the [facility] at (548)304-7903. This is open 24 hours a day. Summary Insomnia is a sleep disorder that makes it difficult to fall asleep or stay asleep. Insomnia can be long-term (chronic) or short-term (acute). Treatment for insomnia depends on the cause. Treatment may focus on treating an underlying condition that is causing insomnia. Keep a sleep diary to help you and your health care provider figure out what could be causing your insomnia. This information is not intended to replace advice given to you by your health care provider. Make sure you discuss any questions you have with your healthcare provider. Document Revised: 05/27/2020 Document Reviewed: 05/27/2020 Elsevier Patient Education  2022 [place], [place]  03/17/21

## 2021-03-17 NOTE — Progress Notes (Signed)
Not sleeping- melatonin does not help

## 2021-04-05 ENCOUNTER — Telehealth (INDEPENDENT_AMBULATORY_CARE_PROVIDER_SITE_OTHER): Payer: Self-pay

## 2021-04-05 NOTE — Telephone Encounter (Signed)
Attempted to call and schedule AWV Initail phone visit but did not reach patient. Left vm with contact info [phone] so he can schedule this over phone with CMA.

## 2021-06-21 ENCOUNTER — Other Ambulatory Visit (INDEPENDENT_AMBULATORY_CARE_PROVIDER_SITE_OTHER): Payer: Self-pay | Admitting: Primary Care

## 2021-06-21 DIAGNOSIS — Z23 Encounter for immunization: Secondary | ICD-10-CM

## 2021-06-21 NOTE — Telephone Encounter (Signed)
Sent to PCP ?

## 2021-07-21 ENCOUNTER — Ambulatory Visit (INDEPENDENT_AMBULATORY_CARE_PROVIDER_SITE_OTHER): Payer: Medicare Other | Admitting: Primary Care

## 2021-08-09 ENCOUNTER — Ambulatory Visit (INDEPENDENT_AMBULATORY_CARE_PROVIDER_SITE_OTHER): Payer: Medicare Other | Admitting: Primary Care

## 2021-08-09 ENCOUNTER — Other Ambulatory Visit: Payer: Self-pay

## 2021-08-15 ENCOUNTER — Institutional Professional Consult (permissible substitution): Payer: Medicare Other | Admitting: Pulmonary Disease

## 2021-08-30 ENCOUNTER — Other Ambulatory Visit: Payer: Self-pay

## 2021-08-30 ENCOUNTER — Ambulatory Visit (INDEPENDENT_AMBULATORY_CARE_PROVIDER_SITE_OTHER): Payer: Medicare Other | Admitting: Primary Care

## 2021-08-30 ENCOUNTER — Encounter (INDEPENDENT_AMBULATORY_CARE_PROVIDER_SITE_OTHER): Payer: Self-pay | Admitting: Primary Care

## 2021-08-30 VITALS — BP 120/85 | HR 87 | Temp 98.0°F | Ht 67.0 in | Wt 159.0 lb

## 2021-08-30 DIAGNOSIS — Z79899 Other long term (current) drug therapy: Secondary | ICD-10-CM

## 2021-08-30 DIAGNOSIS — M24821 Other specific joint derangements of right elbow, not elsewhere classified: Secondary | ICD-10-CM

## 2021-08-30 DIAGNOSIS — G4709 Other insomnia: Secondary | ICD-10-CM | POA: Diagnosis not present

## 2021-08-30 DIAGNOSIS — Z0000 Encounter for general adult medical examination without abnormal findings: Secondary | ICD-10-CM | POA: Diagnosis not present
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
## 2021-08-31 LAB — CMP14+EGFR
ALT: 77 IU/L — ABNORMAL HIGH (ref 0–44)
AST: 117 IU/L — ABNORMAL HIGH (ref 0–40)
Albumin/Globulin Ratio: 1.4 (ref 1.2–2.2)
Albumin: 4 g/dL (ref 3.8–4.9)
Alkaline Phosphatase: 93 IU/L (ref 44–121)
BUN/Creatinine Ratio: 14 (ref 9–20)
BUN: 12 mg/dL (ref 6–24)
Bilirubin Total: 0.6 mg/dL (ref 0.0–1.2)
CO2: 23 mmol/L (ref 20–29)
Calcium: 9.4 mg/dL (ref 8.7–10.2)
Chloride: 96 mmol/L (ref 96–106)
Creatinine, Ser: 0.85 mg/dL (ref 0.76–1.27)
Globulin, Total: 2.8 g/dL (ref 1.5–4.5)
Glucose: 81 mg/dL (ref 70–99)
Potassium: 3.9 mmol/L (ref 3.5–5.2)
Sodium: 137 mmol/L (ref 134–144)
Total Protein: 6.8 g/dL (ref 6.0–8.5)
eGFR: 103 mL/min/{1.73_m2} (ref >59)

## 2021-08-31 LAB — LIPID PANEL
Chol/HDL Ratio: 6.8 ratio — ABNORMAL HIGH (ref 0.0–5.0)
Cholesterol, Total: 212 mg/dL — ABNORMAL HIGH (ref 100–199)
HDL: 31 mg/dL — ABNORMAL LOW (ref >39)
LDL Chol Calc (NIH): 72 mg/dL (ref 0–99)
Triglycerides: 694 mg/dL (ref 0–149)
VLDL Cholesterol Cal: 109 mg/dL — ABNORMAL HIGH (ref 5–40)

## 2021-08-31 LAB — CBC WITH DIFFERENTIAL/PLATELET
Basophils Absolute: 0.1 10*3/uL (ref 0.0–0.2)
Basos: 1 %
EOS (ABSOLUTE): 0 10*3/uL (ref 0.0–0.4)
Eos: 0 %
Hematocrit: 38.7 % (ref 37.5–51.0)
Hemoglobin: 13 g/dL (ref 13.0–17.7)
Immature Grans (Abs): 0 10*3/uL (ref 0.0–0.1)
Immature Granulocytes: 1 %
Lymphocytes Absolute: 0.9 10*3/uL (ref 0.7–3.1)
Lymphs: 17 %
MCH: 33.1 pg — ABNORMAL HIGH (ref 26.6–33.0)
MCHC: 33.6 g/dL (ref 31.5–35.7)
MCV: 99 fL — ABNORMAL HIGH (ref 79–97)
Monocytes Absolute: 0.8 10*3/uL (ref 0.1–0.9)
Monocytes: 15 %
Neutrophils Absolute: 3.4 10*3/uL (ref 1.4–7.0)
Neutrophils: 66 %
Platelets: 314 10*3/uL (ref 150–450)
RBC: 3.93 x10E6/uL — ABNORMAL LOW (ref 4.14–5.80)
RDW: 12.2 % (ref 11.6–15.4)
WBC: 5.1 10*3/uL (ref 3.4–10.8)

## 2021-08-31 MED ORDER — ROSUVASTATIN CALCIUM 40 MG PO TABS: 40.0000 mg | ORAL_TABLET | Freq: Every day | ORAL | 1 refills | Status: DC

## 2021-08-31 MED ORDER — FENOFIBRATE 145 MG PO TABS: 145.0000 mg | ORAL_TABLET | Freq: Every day | ORAL | 1 refills | Status: DC

## 2021-09-20 ENCOUNTER — Ambulatory Visit: Payer: Medicare Other | Admitting: Orthopaedic Surgery

## 2021-09-27 ENCOUNTER — Ambulatory Visit: Payer: Self-pay

## 2021-09-27 ENCOUNTER — Other Ambulatory Visit: Payer: Self-pay

## 2021-09-27 ENCOUNTER — Ambulatory Visit (INDEPENDENT_AMBULATORY_CARE_PROVIDER_SITE_OTHER): Payer: Medicare Other | Admitting: Orthopaedic Surgery

## 2021-09-27 DIAGNOSIS — M25512 Pain in left shoulder: Secondary | ICD-10-CM

## 2021-09-27 DIAGNOSIS — M25511 Pain in right shoulder: Secondary | ICD-10-CM | POA: Diagnosis not present

## 2021-09-27 DIAGNOSIS — M25521 Pain in right elbow: Secondary | ICD-10-CM | POA: Diagnosis not present

## 2021-09-27 DIAGNOSIS — G8929 Other chronic pain: Secondary | ICD-10-CM
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
## 2021-09-29 ENCOUNTER — Encounter: Payer: Self-pay | Admitting: Gastroenterology

## 2021-09-30 ENCOUNTER — Other Ambulatory Visit: Payer: Self-pay

## 2021-09-30 ENCOUNTER — Ambulatory Visit (INDEPENDENT_AMBULATORY_CARE_PROVIDER_SITE_OTHER): Payer: Medicare Other

## 2021-09-30 ENCOUNTER — Telehealth (INDEPENDENT_AMBULATORY_CARE_PROVIDER_SITE_OTHER): Payer: Self-pay

## 2021-09-30 ENCOUNTER — Encounter (INDEPENDENT_AMBULATORY_CARE_PROVIDER_SITE_OTHER): Payer: Self-pay

## 2021-09-30 DIAGNOSIS — Z0000 Encounter for general adult medical examination without abnormal findings: Secondary | ICD-10-CM

## 2021-09-30 NOTE — Progress Notes (Signed)
Called pt to complete AWV, pt declined at this time, not an appropriate time complete. Pt will be rescheduled. Pt aware.  ?

## 2021-10-03 NOTE — Telephone Encounter (Signed)
Error

## 2021-10-14 ENCOUNTER — Ambulatory Visit (INDEPENDENT_AMBULATORY_CARE_PROVIDER_SITE_OTHER): Payer: Medicare Other

## 2021-10-14 ENCOUNTER — Telehealth: Payer: Self-pay

## 2021-10-14 NOTE — Telephone Encounter (Signed)
Attempted to call pt to complete AWV pt was called twice. Appt rescheduled. YL,RMA ?

## 2021-11-25 ENCOUNTER — Ambulatory Visit (INDEPENDENT_AMBULATORY_CARE_PROVIDER_SITE_OTHER): Payer: Medicare Other

## 2021-11-25 DIAGNOSIS — Z0000 Encounter for general adult medical examination without abnormal findings: Secondary | ICD-10-CM

## 2021-11-25 NOTE — Patient Instructions (Signed)
Health Maintenance, Male Adopting a healthy lifestyle and getting preventive care are important in promoting health and wellness. Ask your health care provider about: The right schedule for you to have regular tests and exams. Things you can do on your own to prevent diseases and keep yourself healthy. What should I know about diet, weight, and exercise? Eat a healthy diet  Eat a diet that includes plenty of vegetables, fruits, low-fat dairy products, and lean protein. Do not eat a lot of foods that are high in solid fats, added sugars, or sodium. Maintain a healthy weight Body mass index (BMI) is a measurement that can be used to identify possible weight problems. It estimates body fat based on height and weight. Your health care provider can help determine your BMI and help you achieve or maintain a healthy weight. Get regular exercise Get regular exercise. This is one of the most important things you can do for your health. Most adults should: Exercise for at least 150 minutes each week. The exercise should increase your heart rate and make you sweat (moderate-intensity exercise). Do strengthening exercises at least twice a week. This is in addition to the moderate-intensity exercise. Spend less time sitting. Even light physical activity can be beneficial. Watch cholesterol and blood lipids Have your blood tested for lipids and cholesterol at 56 years of age, then have this test every 5 years. You may need to have your cholesterol levels checked more often if: Your lipid or cholesterol levels are high. You are older than 56 years of age. You are at high risk for heart disease. What should I know about cancer screening? Many types of cancers can be detected early and may often be prevented. Depending on your health history and family history, you may need to have cancer screening at various ages. This may include screening for: Colorectal cancer. Prostate cancer. Skin cancer. Lung  cancer. What should I know about heart disease, diabetes, and high blood pressure? Blood pressure and heart disease High blood pressure causes heart disease and increases the risk of stroke. This is more likely to develop in people who have high blood pressure readings or are overweight. Talk with your health care provider about your target blood pressure readings. Have your blood pressure checked: Every 3-5 years if you are 18-39 years of age. Every year if you are 40 years old or older. If you are between the ages of 65 and 75 and are a current or former smoker, ask your health care provider if you should have a one-time screening for abdominal aortic aneurysm (AAA). Diabetes Have regular diabetes screenings. This checks your fasting blood sugar level. Have the screening done: Once every three years after age 45 if you are at a normal weight and have a low risk for diabetes. More often and at a younger age if you are overweight or have a high risk for diabetes. What should I know about preventing infection? Hepatitis B If you have a higher risk for hepatitis B, you should be screened for this virus. Talk with your health care provider to find out if you are at risk for hepatitis B infection. Hepatitis C Blood testing is recommended for: Everyone born from 1945 through 1965. Anyone with known risk factors for hepatitis C. Sexually transmitted infections (STIs) You should be screened each year for STIs, including gonorrhea and chlamydia, if: You are sexually active and are younger than 56 years of age. You are older than 56 years of age and your   health care provider tells you that you are at risk for this type of infection. Your sexual activity has changed since you were last screened, and you are at increased risk for chlamydia or gonorrhea. Ask your health care provider if you are at risk. Ask your health care provider about whether you are at high risk for HIV. Your health care provider  may recommend a prescription medicine to help prevent HIV infection. If you choose to take medicine to prevent HIV, you should first get tested for HIV. You should then be tested every 3 months for as long as you are taking the medicine. Follow these instructions at home: Alcohol use Do not drink alcohol if your health care provider tells you not to drink. If you drink alcohol: Limit how much you have to 0-2 drinks a day. Know how much alcohol is in your drink. In the [place] one drink equals one 12 oz bottle of beer (355 mL), one 5 oz glass of wine (148 mL), or one 1 oz glass of hard liquor (44 mL). Lifestyle Do not use any products that contain nicotine or tobacco. These products include cigarettes, chewing tobacco, and vaping devices, such as e-cigarettes. If you need help quitting, ask your health care provider. Do not use street drugs. Do not share needles. Ask your health care provider for help if you need support or information about quitting drugs. General instructions Schedule regular health, dental, and eye exams. Stay current with your vaccines. Tell your health care provider if: You often feel depressed. You have ever been abused or do not feel safe at home. Summary Adopting a healthy lifestyle and getting preventive care are important in promoting health and wellness. Follow your health care provider's instructions about healthy diet, exercising, and getting tested or screened for diseases. Follow your health care provider's instructions on monitoring your cholesterol and blood pressure. This information is not intended to replace advice given to you by your health care provider. Make sure you discuss any questions you have with your health care provider. Document Revised: 12/06/2020 Document Reviewed: 12/06/2020 Elsevier Patient Education  2023 [employer]  
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
## 2021-11-30 ENCOUNTER — Other Ambulatory Visit (INDEPENDENT_AMBULATORY_CARE_PROVIDER_SITE_OTHER): Payer: Self-pay | Admitting: Primary Care

## 2021-11-30 ENCOUNTER — Encounter: Payer: Self-pay | Admitting: *Deleted

## 2021-11-30 ENCOUNTER — Ambulatory Visit (INDEPENDENT_AMBULATORY_CARE_PROVIDER_SITE_OTHER): Payer: Medicare Other | Admitting: Primary Care

## 2021-11-30 DIAGNOSIS — Z006 Encounter for examination for normal comparison and control in clinical research program: Secondary | ICD-10-CM

## 2021-11-30 NOTE — Research (Signed)
Message left for Gabriel Mccann to inform him about [employer] research. Encouraged him to call back. ?

## 2021-12-06 ENCOUNTER — Ambulatory Visit (INDEPENDENT_AMBULATORY_CARE_PROVIDER_SITE_OTHER): Payer: Medicare Other | Admitting: Primary Care

## 2021-12-29 ENCOUNTER — Ambulatory Visit (INDEPENDENT_AMBULATORY_CARE_PROVIDER_SITE_OTHER): Payer: Medicare Other | Admitting: Primary Care

## 2022-01-02 ENCOUNTER — Ambulatory Visit (INDEPENDENT_AMBULATORY_CARE_PROVIDER_SITE_OTHER): Payer: Medicare Other | Admitting: Primary Care

## 2022-01-12 ENCOUNTER — Ambulatory Visit (INDEPENDENT_AMBULATORY_CARE_PROVIDER_SITE_OTHER): Payer: Medicare Other | Admitting: Primary Care

## 2022-01-12 ENCOUNTER — Encounter (INDEPENDENT_AMBULATORY_CARE_PROVIDER_SITE_OTHER): Payer: Self-pay | Admitting: Primary Care

## 2022-01-12 VITALS — BP 113/77 | HR 77 | Temp 98.3°F | Ht 67.0 in | Wt 153.0 lb

## 2022-01-12 DIAGNOSIS — Z760 Encounter for issue of repeat prescription: Secondary | ICD-10-CM | POA: Diagnosis not present

## 2022-01-12 DIAGNOSIS — I10 Essential (primary) hypertension: Secondary | ICD-10-CM

## 2022-01-14 MED ORDER — HYDROCHLOROTHIAZIDE 25 MG PO TABS: 25.0000 mg | ORAL_TABLET | Freq: Every day | ORAL | 3 refills | Status: DC

## 2022-01-14 MED ORDER — AMLODIPINE BESYLATE 10 MG PO TABS: 10.0000 mg | ORAL_TABLET | Freq: Every day | ORAL | 3 refills | Status: DC
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
## 2022-03-09 ENCOUNTER — Other Ambulatory Visit (INDEPENDENT_AMBULATORY_CARE_PROVIDER_SITE_OTHER): Payer: Self-pay | Admitting: Primary Care

## 2022-03-09 ENCOUNTER — Telehealth (INDEPENDENT_AMBULATORY_CARE_PROVIDER_SITE_OTHER): Payer: Self-pay | Admitting: Primary Care

## 2022-03-09 NOTE — Telephone Encounter (Signed)
Needs appt- fasting

## 2022-03-09 NOTE — Telephone Encounter (Signed)
Pt called to follow up on refill request / stated his pharmacy sent another request since they were waiting on approval / please advise

## 2022-03-09 NOTE — Telephone Encounter (Signed)
Routed to PCP 

## 2022-04-14 ENCOUNTER — Other Ambulatory Visit (INDEPENDENT_AMBULATORY_CARE_PROVIDER_SITE_OTHER): Payer: Self-pay | Admitting: Primary Care

## 2022-04-14 NOTE — Telephone Encounter (Signed)
Rx has already been sent in 

## 2022-04-14 NOTE — Telephone Encounter (Signed)
Patient called in states, didn't receive theRosuvastatin Calcium 40 mg along with his other medications that were dropped off.

## 2022-04-17 ENCOUNTER — Ambulatory Visit (INDEPENDENT_AMBULATORY_CARE_PROVIDER_SITE_OTHER): Payer: Medicare Other | Admitting: Primary Care

## 2022-05-25 ENCOUNTER — Other Ambulatory Visit (INDEPENDENT_AMBULATORY_CARE_PROVIDER_SITE_OTHER): Payer: Self-pay | Admitting: Primary Care

## 2022-05-26 NOTE — Telephone Encounter (Signed)
Unable to refill per protocol, Rx request is too soon last refill 03/09/22 for 90 and 1. Will refuse.  Requested Prescriptions  Pending Prescriptions Disp Refills  . fenofibrate (TRICOR) 145 MG tablet [Pharmacy Med Name: FENOFIBRATE 145 MG ORAL TABLET] 90 tablet 1    Sig: TAKE 1 TABLET (145 MG TOTAL) BY MOUTH DAILY.     Cardiovascular:  Antilipid - Fibric Acid Derivatives Failed - 05/25/2022 12:42 PM      Failed - ALT in normal range and within 360 days    ALT  Date Value Ref Range Status  08/30/2021 77 (H) 0 - 44 IU/L Final         Failed - AST in normal range and within 360 days    AST  Date Value Ref Range Status  08/30/2021 117 (H) 0 - 40 IU/L Final         Failed - Lipid Panel in normal range within the last 12 months    Cholesterol, Total  Date Value Ref Range Status  08/30/2021 212 (H) 100 - 199 mg/dL Final   LDL Chol Calc (NIH)  Date Value Ref Range Status  08/30/2021 72 0 - 99 mg/dL Final   HDL  Date Value Ref Range Status  08/30/2021 31 (L) >39 mg/dL Final   Triglycerides  Date Value Ref Range Status  08/30/2021 694 (HH) 0 - 149 mg/dL Final         Passed - Cr in normal range and within 360 days    Creatinine, Ser  Date Value Ref Range Status  08/30/2021 0.85 0.76 - 1.27 mg/dL Final         Passed - HGB in normal range and within 360 days    Hemoglobin  Date Value Ref Range Status  08/30/2021 13.0 13.0 - 17.7 g/dL Final         Passed - HCT in normal range and within 360 days    Hematocrit  Date Value Ref Range Status  08/30/2021 38.7 37.5 - 51.0 % Final         Passed - PLT in normal range and within 360 days    Platelets  Date Value Ref Range Status  08/30/2021 314 150 - 450 x10E3/uL Final         Passed - WBC in normal range and within 360 days    WBC  Date Value Ref Range Status  08/30/2021 5.1 3.4 - 10.8 x10E3/uL Final  08/04/2016 20.4 (H) 4.0 - 10.5 K/uL Final         Passed - eGFR is 30 or above and within 360 days    GFR calc Af  Amer  Date Value Ref Range Status  07/20/2020 115 >59 mL/min/1.73 Final    Comment:    **In accordance with recommendations from the NKF-ASN Task force,**   [facility] is in the process of updating its eGFR calculation to the   2021 CKD-EPI creatinine equation that estimates kidney function   without a race variable.    GFR calc non Af Amer  Date Value Ref Range Status  07/20/2020 100 >59 mL/min/1.73 Final   eGFR  Date Value Ref Range Status  08/30/2021 103 >59 mL/min/1.73 Final         Passed - Valid encounter within last 12 months    Recent Outpatient Visits          4 months ago Essential hypertension   [place] [name], NP   8 months  ago Annual physical exam   [place] [name], NP   1 year ago Essential hypertension   [place] Fenton Foy, NP   1 year ago Chronic knee pain (Bilateral)   [place] [name], NP   1 year ago Chronic pain syndrome   [place] [facility] [name], NP             Signed Prescriptions Disp Refills   rosuvastatin (CRESTOR) 40 MG tablet 90 tablet 0    Sig: TAKE 1 TABLET (40 MG TOTAL) BY MOUTH DAILY     Cardiovascular:  Antilipid - Statins 2 Failed - 05/25/2022 12:42 PM      Failed - Lipid Panel in normal range within the last 12 months    Cholesterol, Total  Date Value Ref Range Status  08/30/2021 212 (H) 100 - 199 mg/dL Final   LDL Chol Calc (NIH)  Date Value Ref Range Status  08/30/2021 72 0 - 99 mg/dL Final   HDL  Date Value Ref Range Status  08/30/2021 31 (L) >39 mg/dL Final   Triglycerides  Date Value Ref Range Status  08/30/2021 694 (HH) 0 - 149 mg/dL Final         Passed - Cr in normal range and within 360 days    Creatinine, Ser  Date Value Ref Range Status  08/30/2021 0.85 0.76 - 1.27 mg/dL Final         Passed - Patient is not pregnant       Passed - Valid encounter within last 12 months    Recent Outpatient Visits          4 months ago Essential hypertension   [place], Michelle P, NP   8 months ago Annual physical exam   [place] [name], NP   1 year ago Essential hypertension   [place] Fenton Foy, NP   1 year ago Chronic knee pain (Bilateral)   [facility] [name], NP   1 year ago Chronic pain syndrome   [place] [name], NP

## 2022-05-26 NOTE — Telephone Encounter (Signed)
Requested Prescriptions  Pending Prescriptions Disp Refills  . rosuvastatin (CRESTOR) 40 MG tablet [Pharmacy Med Name: ROSUVASTATIN CALCIUM 40 MG ORAL TABLET] 90 tablet 0    Sig: TAKE 1 TABLET (40 MG TOTAL) BY MOUTH DAILY     Cardiovascular:  Antilipid - Statins 2 Failed - 05/25/2022 12:42 PM      Failed - Lipid Panel in normal range within the last 12 months    Cholesterol, Total  Date Value Ref Range Status  08/30/2021 212 (H) 100 - 199 mg/dL Final   LDL Chol Calc (NIH)  Date Value Ref Range Status  08/30/2021 72 0 - 99 mg/dL Final   HDL  Date Value Ref Range Status  08/30/2021 31 (L) >39 mg/dL Final   Triglycerides  Date Value Ref Range Status  08/30/2021 694 (HH) 0 - 149 mg/dL Final         Passed - Cr in normal range and within 360 days    Creatinine, Ser  Date Value Ref Range Status  08/30/2021 0.85 0.76 - 1.27 mg/dL Final         Passed - Patient is not pregnant      Passed - Valid encounter within last 12 months    Recent Outpatient Visits          4 months ago Essential hypertension   [place], Michelle [place], NP   8 months ago Annual physical exam   [place], [place], NP   1 year ago Essential hypertension   [place] Fenton Foy, NP   1 year ago Chronic knee pain (Bilateral)   [place] [name], NP   1 year ago Chronic pain syndrome   [place] [facility] [place], Michelle P, NP             . fenofibrate (TRICOR) 145 MG tablet [Pharmacy Med Name: FENOFIBRATE 145 MG ORAL TABLET] 90 tablet 1    Sig: TAKE 1 TABLET (145 MG TOTAL) BY MOUTH DAILY.     Cardiovascular:  Antilipid - Fibric Acid Derivatives Failed - 05/25/2022 12:42 PM      Failed - ALT in normal range and within 360 days    ALT  Date Value Ref Range Status  08/30/2021 77 (H) 0 - 44 IU/L Final         Failed - AST in normal range and within  360 days    AST  Date Value Ref Range Status  08/30/2021 117 (H) 0 - 40 IU/L Final         Failed - Lipid Panel in normal range within the last 12 months    Cholesterol, Total  Date Value Ref Range Status  08/30/2021 212 (H) 100 - 199 mg/dL Final   LDL Chol Calc (NIH)  Date Value Ref Range Status  08/30/2021 72 0 - 99 mg/dL Final   HDL  Date Value Ref Range Status  08/30/2021 31 (L) >39 mg/dL Final   Triglycerides  Date Value Ref Range Status  08/30/2021 694 (HH) 0 - 149 mg/dL Final         Passed - Cr in normal range and within 360 days    Creatinine, Ser  Date Value Ref Range Status  08/30/2021 0.85 0.76 - 1.27 mg/dL Final         Passed - HGB in normal range and within 360 days    Hemoglobin  Date Value Ref Range Status  08/30/2021 13.0 13.0 - 17.7 g/dL Final         Passed - HCT in normal range and within 360 days    Hematocrit  Date Value Ref Range Status  08/30/2021 38.7 37.5 - 51.0 % Final         Passed - PLT in normal range and within 360 days    Platelets  Date Value Ref Range Status  08/30/2021 314 150 - 450 x10E3/uL Final         Passed - WBC in normal range and within 360 days    WBC  Date Value Ref Range Status  08/30/2021 5.1 3.4 - 10.8 x10E3/uL Final  08/04/2016 20.4 (H) 4.0 - 10.5 K/uL Final         Passed - eGFR is 30 or above and within 360 days    GFR calc Af Amer  Date Value Ref Range Status  07/20/2020 115 >59 mL/min/1.73 Final    Comment:    **In accordance with recommendations from the NKF-ASN Task force,**   [facility] is in the process of updating its eGFR calculation to the   2021 CKD-EPI creatinine equation that estimates kidney function   without a race variable.    GFR calc non Af Amer  Date Value Ref Range Status  07/20/2020 100 >59 mL/min/1.73 Final   eGFR  Date Value Ref Range Status  08/30/2021 103 >59 mL/min/1.73 Final         Passed - Valid encounter within last 12 months    Recent Outpatient Visits           4 months ago Essential hypertension   [place], [place], NP   8 months ago Annual physical exam   [place], [place], NP   1 year ago Essential hypertension   [place] Fenton Foy, NP   1 year ago Chronic knee pain (Bilateral)   [facility] [name], NP   1 year ago Chronic pain syndrome   [facility] [name], NP

## 2022-09-18 ENCOUNTER — Other Ambulatory Visit (INDEPENDENT_AMBULATORY_CARE_PROVIDER_SITE_OTHER): Payer: Self-pay | Admitting: Primary Care

## 2022-09-18 NOTE — Telephone Encounter (Signed)
Will forward to provider. No follow up and no current lipid

## 2022-09-19 ENCOUNTER — Ambulatory Visit (INDEPENDENT_AMBULATORY_CARE_PROVIDER_SITE_OTHER): Payer: Medicare Other | Admitting: Primary Care

## 2022-09-28 ENCOUNTER — Telehealth (INDEPENDENT_AMBULATORY_CARE_PROVIDER_SITE_OTHER): Payer: 59 | Admitting: Primary Care

## 2022-09-29 ENCOUNTER — Telehealth (INDEPENDENT_AMBULATORY_CARE_PROVIDER_SITE_OTHER): Payer: 59 | Admitting: Primary Care

## 2022-10-11 ENCOUNTER — Encounter (INDEPENDENT_AMBULATORY_CARE_PROVIDER_SITE_OTHER): Payer: Self-pay

## 2022-10-18 ENCOUNTER — Ambulatory Visit (INDEPENDENT_AMBULATORY_CARE_PROVIDER_SITE_OTHER): Payer: 59 | Admitting: Primary Care

## 2022-10-18 ENCOUNTER — Encounter (INDEPENDENT_AMBULATORY_CARE_PROVIDER_SITE_OTHER): Payer: Self-pay | Admitting: Primary Care

## 2022-10-18 VITALS — BP 130/86 | HR 87 | Resp 16 | Ht 69.0 in | Wt 159.0 lb

## 2022-10-18 DIAGNOSIS — E782 Mixed hyperlipidemia: Secondary | ICD-10-CM | POA: Diagnosis not present

## 2022-10-18 DIAGNOSIS — Z1211 Encounter for screening for malignant neoplasm of colon: Secondary | ICD-10-CM | POA: Diagnosis not present

## 2022-10-18 DIAGNOSIS — Z131 Encounter for screening for diabetes mellitus: Secondary | ICD-10-CM

## 2022-10-18 DIAGNOSIS — Z1159 Encounter for screening for other viral diseases: Secondary | ICD-10-CM

## 2022-10-18 DIAGNOSIS — I10 Essential (primary) hypertension: Secondary | ICD-10-CM | POA: Diagnosis not present

## 2022-10-18 DIAGNOSIS — R351 Nocturia: Secondary | ICD-10-CM

## 2022-10-18 DIAGNOSIS — Z760 Encounter for issue of repeat prescription: Secondary | ICD-10-CM

## 2022-10-18 MED ORDER — HYDROCHLOROTHIAZIDE 25 MG PO TABS: 25.0000 mg | ORAL_TABLET | Freq: Every day | ORAL | 3 refills | Status: DC

## 2022-10-18 MED ORDER — AMLODIPINE BESYLATE 10 MG PO TABS: 10.0000 mg | ORAL_TABLET | Freq: Every day | ORAL | 3 refills | Status: DC

## 2022-10-18 NOTE — Addendum Note (Signed)
Addended by: Juluis Mire on: 10/18/2022 02:09 PM   Modules accepted: Orders
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
## 2022-10-19 LAB — CBC WITH DIFFERENTIAL/PLATELET
Basophils Absolute: 0 10*3/uL (ref 0.0–0.2)
Basos: 1 %
EOS (ABSOLUTE): 0 10*3/uL (ref 0.0–0.4)
Eos: 1 %
Hematocrit: 38.7 % (ref 37.5–51.0)
Hemoglobin: 13.2 g/dL (ref 13.0–17.7)
Immature Grans (Abs): 0 10*3/uL (ref 0.0–0.1)
Immature Granulocytes: 0 %
Lymphocytes Absolute: 1 10*3/uL (ref 0.7–3.1)
Lymphs: 25 %
MCH: 33.8 pg — ABNORMAL HIGH (ref 26.6–33.0)
MCHC: 34.1 g/dL (ref 31.5–35.7)
MCV: 99 fL — ABNORMAL HIGH (ref 79–97)
Monocytes Absolute: 0.4 10*3/uL (ref 0.1–0.9)
Monocytes: 9 %
Neutrophils Absolute: 2.7 10*3/uL (ref 1.4–7.0)
Neutrophils: 64 %
Platelets: 352 10*3/uL (ref 150–450)
RBC: 3.9 x10E6/uL — ABNORMAL LOW (ref 4.14–5.80)
RDW: 12.5 % (ref 11.6–15.4)
WBC: 4.1 10*3/uL (ref 3.4–10.8)

## 2022-10-19 LAB — CMP14+EGFR
ALT: 170 IU/L — ABNORMAL HIGH (ref 0–44)
AST: 374 IU/L — ABNORMAL HIGH (ref 0–40)
Albumin/Globulin Ratio: 2 (ref 1.2–2.2)
Albumin: 4.7 g/dL (ref 3.8–4.9)
Alkaline Phosphatase: 140 IU/L — ABNORMAL HIGH (ref 44–121)
BUN/Creatinine Ratio: 8 — ABNORMAL LOW (ref 9–20)
BUN: 10 mg/dL (ref 6–24)
Bilirubin Total: 0.4 mg/dL (ref 0.0–1.2)
CO2: 24 mmol/L (ref 20–29)
Calcium: 9.8 mg/dL (ref 8.7–10.2)
Chloride: 102 mmol/L (ref 96–106)
Creatinine, Ser: 1.2 mg/dL (ref 0.76–1.27)
Globulin, Total: 2.3 g/dL (ref 1.5–4.5)
Glucose: 89 mg/dL (ref 70–99)
Potassium: 4.4 mmol/L (ref 3.5–5.2)
Sodium: 146 mmol/L — ABNORMAL HIGH (ref 134–144)
Total Protein: 7 g/dL (ref 6.0–8.5)
eGFR: 71 mL/min/{1.73_m2} (ref >59)

## 2022-10-19 LAB — LIPID PANEL
Chol/HDL Ratio: 2.2 ratio (ref 0.0–5.0)
Cholesterol, Total: 148 mg/dL (ref 100–199)
HDL: 66 mg/dL (ref >39)
LDL Chol Calc (NIH): 37 mg/dL (ref 0–99)
Triglycerides: 303 mg/dL — ABNORMAL HIGH (ref 0–149)
VLDL Cholesterol Cal: 45 mg/dL — ABNORMAL HIGH (ref 5–40)

## 2022-10-19 LAB — HCV AB W REFLEX TO QUANT PCR: HCV Ab: NONREACTIVE

## 2022-10-19 LAB — PSA: Prostate Specific Ag, Serum: 0.5 ng/mL (ref 0.0–4.0)

## 2022-10-19 LAB — HEMOGLOBIN A1C
Est. average glucose Bld gHb Est-mCnc: 91 mg/dL
Hgb A1c MFr Bld: 4.8 % (ref 4.8–5.6)

## 2022-10-19 LAB — HCV INTERPRETATION

## 2022-11-02 ENCOUNTER — Telehealth: Payer: Self-pay | Admitting: *Deleted

## 2022-11-02 NOTE — Telephone Encounter (Signed)
Patient requesting to review most recent labs 10/18/22. Pt given lab results per notes of M. Oletta Lamas, NP from 10/19/22 on 11/02/22. Pt verbalized understanding of elevated liver enzymes and reports he has since stop drinking alcohol. Patient concerned "some thing else may be wrong". Continues to have watery stools. Requesting referral to GI. Reviewed OV notes and reviewed with patient NP did ambulatory referral for GI. Patient reports he has not received any calls for another colonoscopy check. Please send another referral to GI.  Please advise.

## 2022-11-03 ENCOUNTER — Encounter: Payer: Self-pay | Admitting: Gastroenterology

## 2022-11-03 NOTE — Telephone Encounter (Signed)
Returned pt call and provided information to [facility] for colonoscopy pt doesn't have any questions or concerns

## 2022-11-10 ENCOUNTER — Encounter (INDEPENDENT_AMBULATORY_CARE_PROVIDER_SITE_OTHER): Payer: 59 | Admitting: Primary Care

## 2022-11-24 ENCOUNTER — Ambulatory Visit (AMBULATORY_SURGERY_CENTER): Payer: 59

## 2022-11-24 ENCOUNTER — Encounter: Payer: Self-pay | Admitting: Gastroenterology

## 2022-11-24 VITALS — Ht 69.0 in | Wt 165.0 lb

## 2022-11-24 DIAGNOSIS — Z86010 Personal history of colonic polyps: Secondary | ICD-10-CM

## 2022-11-24 MED ORDER — NA SULFATE-K SULFATE-MG SULF 17.5-3.13-1.6 GM/177ML PO SOLN: 1.0000 | Freq: Once | ORAL | 0 refills | Status: AC

## 2022-11-24 NOTE — Progress Notes (Signed)
No egg or soy allergy known to patient  No issues known to pt with past sedation with any surgeries or procedures Patient denies ever being told they had issues or difficulty with intubation  No FH of Malignant Hyperthermia Pt is not on diet pills Pt is not on  home 02  Pt is not on blood thinners  Pt denies issues with constipation  No A fib or A flutter Have any cardiac testing pending--no Pt instructed to use Singlecare.com or GoodRx for a price reduction on prep  Patient's chart reviewed by Cathlyn Parsons CNRA prior to previsit and patient appropriate for the [facility].  Previsit completed and red dot placed by patient's name on their procedure day (on provider's schedule).   Can ambulate without assitance

## 2022-12-06 ENCOUNTER — Telehealth: Payer: Self-pay | Admitting: Gastroenterology

## 2022-12-06 DIAGNOSIS — Z86010 Personal history of colonic polyps: Secondary | ICD-10-CM

## 2022-12-06 MED ORDER — NA SULFATE-K SULFATE-MG SULF 17.5-3.13-1.6 GM/177ML PO SOLN: 1.0000 | Freq: Once | ORAL | 0 refills | Status: AC

## 2022-12-06 NOTE — Telephone Encounter (Signed)
Patient called and stated that his prep medication has not been sent to [facility] yet. Requesting a call back once this has been called in. Please advise, thank you.

## 2022-12-06 NOTE — Telephone Encounter (Signed)
Verified pharmacy with pt. And suprep sent in per request.

## 2022-12-13 ENCOUNTER — Encounter (INDEPENDENT_AMBULATORY_CARE_PROVIDER_SITE_OTHER): Payer: 59 | Admitting: Primary Care

## 2022-12-27 ENCOUNTER — Encounter: Payer: Self-pay | Admitting: Gastroenterology

## 2022-12-27 ENCOUNTER — Ambulatory Visit (AMBULATORY_SURGERY_CENTER): Payer: 59 | Admitting: Gastroenterology

## 2022-12-27 VITALS — BP 108/64 | HR 82 | Temp 97.3°F | Resp 27 | Ht 69.0 in | Wt 165.0 lb

## 2022-12-27 DIAGNOSIS — Z86010 Personal history of colonic polyps: Secondary | ICD-10-CM | POA: Diagnosis not present

## 2022-12-27 DIAGNOSIS — Z09 Encounter for follow-up examination after completed treatment for conditions other than malignant neoplasm: Secondary | ICD-10-CM

## 2022-12-27 DIAGNOSIS — K6389 Other specified diseases of intestine: Secondary | ICD-10-CM | POA: Diagnosis not present

## 2022-12-27 DIAGNOSIS — D3A026 Benign carcinoid tumor of the rectum: Secondary | ICD-10-CM

## 2022-12-27 DIAGNOSIS — K6282 Dysplasia of anus: Secondary | ICD-10-CM | POA: Diagnosis not present

## 2022-12-27 DIAGNOSIS — K635 Polyp of colon: Secondary | ICD-10-CM

## 2022-12-27 DIAGNOSIS — D120 Benign neoplasm of cecum: Secondary | ICD-10-CM

## 2022-12-27 MED ORDER — SODIUM CHLORIDE 0.9 % IV SOLN
500.0000 mL | Freq: Once | INTRAVENOUS | Status: DC
Start: 2022-12-27 — End: 2022-12-27

## 2022-12-27 NOTE — Op Note (Signed)
[facility] Patient Name: Gabriel Mccann Procedure Date: 12/27/2022 8:46 AM MRN: [id number] Endoscopist: Viviann Spare P. Adela Lank , MD, [id number] Age: 57 Referring MD:  Date of Birth: 1965/10/07 Gender: Male Account #: [id number] Procedure:                Colonoscopy Indications:              High risk colon cancer surveillance: Personal                            history of colonic polyps - small rectal carcinoid                            resected per Dr. Christella Hartigan 09/2020, history of polyps Medicines:                Monitored Anesthesia Care Procedure:                Pre-Anesthesia Assessment:                           - Prior to the procedure, a History and Physical                            was performed, and patient medications and                            allergies were reviewed. The patient's tolerance of                            previous anesthesia was also reviewed. The risks                            and benefits of the procedure and the sedation                            options and risks were discussed with the patient.                            All questions were answered, and informed consent                            was obtained. Prior Anticoagulants: The patient has                            taken no anticoagulant or antiplatelet agents. ASA                            Grade Assessment: II - A patient with mild systemic                            disease. After reviewing the risks and benefits,                            the patient was deemed in satisfactory condition to  undergo the procedure.                           After obtaining informed consent, the colonoscope                            was passed under direct vision. Throughout the                            procedure, the patient's blood pressure, pulse, and                            oxygen saturations were monitored continuously. The                            CF  [id number] #[id number] was introduced through the anus                            and advanced to the the cecum, identified by                            appendiceal orifice and ileocecal valve. The                            colonoscopy was performed without difficulty. The                            patient tolerated the procedure well. The quality                            of the bowel preparation was adequate. The                            ileocecal valve, appendiceal orifice, and rectum                            were photographed. Scope In: 8:58:19 AM Scope Out: 9:14:57 AM Scope Withdrawal Time: 0 hours 14 minutes 55 seconds  Total Procedure Duration: 0 hours 16 minutes 38 seconds  Findings:                 Skin tags were found on perianal exam.                           A diminutive polyp was found in the cecum. The                            polyp was sessile. The polyp was removed with a                            cold snare. Resection and retrieval were complete.                           A few small-mouthed diverticula were found in the  ascending colon.                           Anal papilla(e) was hypertrophied. Biopsies were                            taken with a cold forceps for histology to rule out                            AIN.                           Internal hemorrhoids were found during retroflexion.                           The exam was otherwise without abnormality. No                            obvious scar in the rectum from prior carcinoid                            resection. No evidence of recurrent lesions. Of                            note, entrance to ileum was rather angulated, ileum                            not intubated. Complications:            No immediate complications. Estimated blood loss:                            Minimal. Estimated Blood Loss:     Estimated blood loss was minimal. Impression:               -  Perianal skin tags found on perianal exam.                           - One diminutive polyp in the cecum, removed with a                            cold snare. Resected and retrieved.                           - Diverticulosis in the ascending colon.                           - Anal papilla(e) was hypertrophied. Biopsied to                            rule out AIN.                           - Internal hemorrhoids.                           - The examination  was otherwise normal.                           - The GI Genius (intelligent endoscopy module),                            computer-aided polyp detection system powered by AI                            was utilized to detect colorectal polyps through                            enhanced visualization during colonoscopy. Recommendation:           - Patient has a contact number available for                            emergencies. The signs and symptoms of potential                            delayed complications were discussed with the                            patient. Return to normal activities tomorrow.                            Written discharge instructions were provided to the                            patient.                           - Resume previous diet.                           - Continue present medications.                           - Await pathology results. Viviann Spare P. Adela Lank, MD 12/27/2022 9:22:37 AM This report has been signed electronically.

## 2022-12-27 NOTE — Progress Notes (Signed)
Called to room to assist during endoscopic procedure.  Patient ID and intended procedure confirmed with present staff. Received instructions for my participation in the procedure from the performing physician.  

## 2022-12-27 NOTE — Patient Instructions (Signed)
Thank you for coming in to see [place] today. Resume your diet and regular medications today. Return to regular daily activities tomorrow. Biopsy results will available in 1-2 weeks, and recommendation will be made for next colonoscopy.    YOU HAD AN ENDOSCOPIC PROCEDURE TODAY AT THE [facility]:   Refer to the procedure report that was given to you for any specific questions about what was found during the examination.  If the procedure report does not answer your questions, please call your gastroenterologist to clarify.  If you requested that your care partner not be given the details of your procedure findings, then the procedure report has been included in a sealed envelope for you to review at your convenience later.  YOU SHOULD EXPECT: Some feelings of bloating in the abdomen. Passage of more gas than usual.  Walking can help get rid of the air that was put into your GI tract during the procedure and reduce the bloating. If you had a lower endoscopy (such as a colonoscopy or flexible sigmoidoscopy) you may notice spotting of blood in your stool or on the toilet paper. If you underwent a bowel prep for your procedure, you may not have a normal bowel movement for a few days.  Please Note:  You might notice some irritation and congestion in your nose or some drainage.  This is from the oxygen used during your procedure.  There is no need for concern and it should clear up in a day or so.  SYMPTOMS TO REPORT IMMEDIATELY:  Following lower endoscopy (colonoscopy or flexible sigmoidoscopy):  Excessive amounts of blood in the stool  Significant tenderness or worsening of abdominal pains  Swelling of the abdomen that is new, acute  Fever of 100F or higher    For urgent or emergent issues, a gastroenterologist can be reached at any hour by calling [phone]. Do not use MyChart messaging for urgent concerns.    DIET:  We do recommend a small meal at first, but then you may  proceed to your regular diet.  Drink plenty of fluids but you should avoid alcoholic beverages for 24 hours.  ACTIVITY:  You should plan to take it easy for the rest of today and you should NOT DRIVE or use heavy machinery until tomorrow (because of the sedation medicines used during the test).    FOLLOW UP: Our staff will call the number listed on your records the next business day following your procedure.  We will call around 7:15- 8:00 am to check on you and address any questions or concerns that you may have regarding the information given to you following your procedure. If we do not reach you, we will leave a message.     If any biopsies were taken you will be contacted by phone or by letter within the next 1-3 weeks.  Please call us at [phone] if you have not heard about the biopsies in 3 weeks.    SIGNATURES/CONFIDENTIALITY: You and/or your care partner have signed paperwork which will be entered into your electronic medical record.  These signatures attest to the fact that that the information above on your After Visit Summary has been reviewed and is understood.  Full responsibility of the confidentiality of this discharge information lies with you and/or your care-partner.
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
## 2022-12-27 NOTE — Progress Notes (Signed)
Pt's states no medical or surgical changes since previsit or office visit. 

## 2022-12-27 NOTE — Progress Notes (Signed)
Uneventful anesthetic. Report to pacu rn. Vss. Care resumed by rn. 

## 2022-12-28 ENCOUNTER — Telehealth: Payer: Self-pay

## 2022-12-28 NOTE — Telephone Encounter (Signed)
  Follow up Call-     12/27/2022    8:39 AM 10/05/2020    8:10 AM  Call back number  Post procedure Call Back phone  # [phone]  Permission to leave phone message Yes Yes     Patient questions:  Do you have a fever, pain , or abdominal swelling? No. Pain Score  0 *  Have you tolerated food without any problems? Yes.    Have you been able to return to your normal activities? Yes.    Do you have any questions about your discharge instructions: Diet   No. Medications  No. Follow up visit  No.  Do you have questions or concerns about your Care? No.  Actions: * If pain score is 4 or above: No action needed, pain <4.

## 2023-01-01 ENCOUNTER — Encounter (INDEPENDENT_AMBULATORY_CARE_PROVIDER_SITE_OTHER): Payer: 59 | Admitting: Primary Care

## 2023-01-09 ENCOUNTER — Ambulatory Visit: Payer: Self-pay | Admitting: General Surgery

## 2023-01-09 NOTE — H&P (Signed)
REFERRING PHYSICIAN:  Ruffin Frederick*   PROVIDER:  Elenora Gamma, MD   MRN: [id number] DOB: 1965-12-20 DATE OF ENCOUNTER: 01/09/2023   Subjective    Chief Complaint: New Consultation ( Eval of AIN)       History of Present Illness: Gabriel Mccann is a 57 y.o. male who is seen today as an office consultation at the request of Dr. Adela Lank for evaluation of New Consultation ( Eval of AIN) .  57 year old male who underwent recent colonoscopy.  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     Review of Systems: A complete review of systems was obtained from the patient.  I have reviewed this information and discussed as appropriate with the patient.  See HPI as well for other ROS.       Medical History: Past Medical History  History reviewed. No pertinent past medical history.      Problem List  There is no problem list on file for this patient.      Past Surgical History  History reviewed. No pertinent surgical history.      Allergies  No Known Allergies     Medications Ordered Prior to Encounter        Current Outpatient Medications on File Prior to Visit  Medication Sig Dispense Refill   amLODIPine (NORVASC) 10 MG tablet Take 1 tablet by mouth once daily        No current facility-administered medications on file prior to visit.        Family History  History reviewed. No pertinent family history.      Tobacco Use History  Social History       Tobacco Use  Smoking Status Not on file  Smokeless Tobacco Not on file        Social History  Social History        Socioeconomic History   Marital status: Single    Social Determinants of Health        Financial Resource Strain: Low Risk  (11/25/2021)    Received from [facility]    Overall Financial Resource Strain (CARDIA)     Difficulty of Paying Living Expenses: Not hard at all  Food Insecurity: No Food Insecurity (11/25/2021)     Received from [facility]    Hunger Vital Sign     Worried About Running Out of Food in the Last Year: Never true     Ran Out of Food in the Last Year: Never true  Transportation Needs: No Transportation Needs (11/25/2021)    Received from [facility] - Transportation     Lack of Transportation (Medical): No     Lack of Transportation (Non-Medical): No  Physical Activity: Sufficiently Active (11/25/2021)    Received from [facility]    Exercise Vital Sign     Days of Exercise per Week: 5 days     Minutes of Exercise per Session: 150+ min  Stress: No Stress Concern Present (11/25/2021)    Received from [facility] of Occupational Health - Occupational Stress Questionnaire     Feeling of Stress : Not at all  Social Connections: Moderately Integrated (11/25/2021)    Received from [facility]    Social Connection and Isolation Panel [NHANES]     Frequency of Communication with Friends and Family: More than three times a week     Frequency of Social Gatherings with Friends  and Family: More than three times a week     Attends Religious Services: More than 4 times per year     Active Member of Clubs or Organizations: Yes     Attends Banker Meetings: More than 4 times per year     Marital Status: Never married        Objective:         Vitals:    01/09/23 1131  BP: 113/72  Pulse: 84  Temp: 36.7 C (98.1 F)  SpO2: 96%  Weight: 71.9 kg (158 lb 9.6 oz)  Height: 172.7 cm (5\' 8" )  PainSc: 0-No pain      Exam Gen: NAD Abd: soft       Labs, Imaging and Diagnostic Testing: Colonoscopy report and pictures reviewed   Assessment and Plan:  Diagnoses and all orders for this visit:   AIN (anal intraepithelial neoplasia) anal canal     57 year old male with hypertrophied anal papilla seen on colonoscopy and biopsy does show a grade 1.  We discussed proceeding with resection versus active surveillance in the office.  Patient elected to  undergo resection.  We will schedule this at his convenience as an outpatient procedure.   [name], MD Colon and Rectal Surgery [facility]

## 2023-01-12 ENCOUNTER — Ambulatory Visit (INDEPENDENT_AMBULATORY_CARE_PROVIDER_SITE_OTHER): Payer: 59 | Admitting: Primary Care

## 2023-01-12 ENCOUNTER — Encounter (INDEPENDENT_AMBULATORY_CARE_PROVIDER_SITE_OTHER): Payer: Self-pay | Admitting: Primary Care

## 2023-01-12 DIAGNOSIS — Z0000 Encounter for general adult medical examination without abnormal findings: Secondary | ICD-10-CM | POA: Diagnosis not present

## 2023-01-12 DIAGNOSIS — H539 Unspecified visual disturbance: Secondary | ICD-10-CM | POA: Diagnosis not present
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
## 2023-01-13 ENCOUNTER — Other Ambulatory Visit (INDEPENDENT_AMBULATORY_CARE_PROVIDER_SITE_OTHER): Payer: Self-pay | Admitting: Primary Care

## 2023-01-18 ENCOUNTER — Other Ambulatory Visit (INDEPENDENT_AMBULATORY_CARE_PROVIDER_SITE_OTHER): Payer: Self-pay | Admitting: Primary Care

## 2023-01-18 NOTE — Telephone Encounter (Signed)
Requested Prescriptions  Pending Prescriptions Disp Refills   rosuvastatin (CRESTOR) 40 MG tablet [Pharmacy Med Name: ROSUVASTATIN CALCIUM 40 MG ORAL TABLET] 90 tablet 0    Sig: TAKE 1 TABLET (40 MG TOTAL) BY MOUTH DAILY     Cardiovascular:  Antilipid - Statins 2 Failed - 01/18/2023 12:23 PM      Failed - Lipid Panel in normal range within the last 12 months    Cholesterol, Total  Date Value Ref Range Status  10/18/2022 148 100 - 199 mg/dL Final   LDL Chol Calc (NIH)  Date Value Ref Range Status  10/18/2022 37 0 - 99 mg/dL Final   HDL  Date Value Ref Range Status  10/18/2022 66 >39 mg/dL Final   Triglycerides  Date Value Ref Range Status  10/18/2022 303 (H) 0 - 149 mg/dL Final         Passed - Cr in normal range and within 360 days    Creatinine, Ser  Date Value Ref Range Status  10/18/2022 1.20 0.76 - 1.27 mg/dL Final         Passed - Patient is not pregnant      Passed - Valid encounter within last 12 months    Recent Outpatient Visits           6 days ago Encounter for [employer] annual wellness exam   [place] [facility] Grayce Sessions, NP   3 months ago Essential hypertension   [place] [facility] Grayce Sessions, NP   1 year ago Essential hypertension   [place] [facility] Grayce Sessions, NP   1 year ago Annual physical exam   [place] [facility] Grayce Sessions, NP   1 year ago Essential hypertension   [place] [facility] Ivonne Andrew, NP
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
## 2023-02-21 ENCOUNTER — Encounter (HOSPITAL_BASED_OUTPATIENT_CLINIC_OR_DEPARTMENT_OTHER): Payer: Self-pay | Admitting: General Surgery

## 2023-02-21 NOTE — Progress Notes (Signed)
Spoke w/ via phone for pre-op interview---pt Lab needs dos---- I stat, ekg              Lab results------none COVID test -----patient states asymptomatic no test needed Arrive at -------700 03-09-2023 NPO after MN NO Solid Food.  Clear liquids from MN until---600 am Med rec completed Medications to take morning of surgery -----amlodipine, fenofibrate, rosuvastatin Diabetic medication -----n/a Patient instructed no nail polish to be worn day of surgery Patient instructed to bring photo id and insurance card day of surgery Patient aware to have Driver (ride ) / caregiver  will arrange for responsible adult to ride home with pt  in [facility] , caregiver at home 29 year old son  for 24 hours after surgery  Patient Special Instructions -----none Pre-Op special Instructions -----none Patient verbalized understanding of instructions that were given at this phone interview. Patient denies shortness of breath, chest pain, fever, cough at this phone interview.

## 2023-03-08 ENCOUNTER — Encounter (INDEPENDENT_AMBULATORY_CARE_PROVIDER_SITE_OTHER): Payer: Self-pay | Admitting: Primary Care

## 2023-03-08 DIAGNOSIS — F411 Generalized anxiety disorder: Secondary | ICD-10-CM

## 2023-03-09 ENCOUNTER — Ambulatory Visit (HOSPITAL_BASED_OUTPATIENT_CLINIC_OR_DEPARTMENT_OTHER): Payer: 59 | Admitting: Anesthesiology

## 2023-03-09 ENCOUNTER — Encounter (HOSPITAL_BASED_OUTPATIENT_CLINIC_OR_DEPARTMENT_OTHER)
Admission: RE | Disposition: A | Discharge status: Home / Self Care | Payer: Self-pay | Source: Home / Self Care | Attending: General Surgery

## 2023-03-09 ENCOUNTER — Ambulatory Visit (HOSPITAL_BASED_OUTPATIENT_CLINIC_OR_DEPARTMENT_OTHER)
Admission: RE | Admit: 2023-03-09 | Discharge: 2023-03-09 | Disposition: A | Discharge status: Home / Self Care | Payer: 59 | Attending: General Surgery | Admitting: General Surgery

## 2023-03-09 ENCOUNTER — Encounter (HOSPITAL_BASED_OUTPATIENT_CLINIC_OR_DEPARTMENT_OTHER): Payer: Self-pay | Admitting: General Surgery

## 2023-03-09 ENCOUNTER — Other Ambulatory Visit: Payer: Self-pay

## 2023-03-09 DIAGNOSIS — I10 Essential (primary) hypertension: Secondary | ICD-10-CM | POA: Diagnosis not present

## 2023-03-09 DIAGNOSIS — K6282 Dysplasia of anus: Secondary | ICD-10-CM | POA: Insufficient documentation

## 2023-03-09 DIAGNOSIS — Z01818 Encounter for other preprocedural examination: Secondary | ICD-10-CM

## 2023-03-09 DIAGNOSIS — K6289 Other specified diseases of anus and rectum: Secondary | ICD-10-CM | POA: Insufficient documentation

## 2023-03-09 LAB — POCT I-STAT, CHEM 8
BUN: 12 mg/dL (ref 6–20)
Calcium, Ion: 1.14 mmol/L — ABNORMAL LOW (ref 1.15–1.40)
Chloride: 101 mmol/L (ref 98–111)
Creatinine, Ser: 0.9 mg/dL (ref 0.61–1.24)
Glucose, Bld: 98 mg/dL (ref 70–99)
HCT: 41 % (ref 39.0–52.0)
Hemoglobin: 13.9 g/dL (ref 13.0–17.0)
Potassium: 3.6 mmol/L (ref 3.5–5.1)
Sodium: 136 mmol/L (ref 135–145)
TCO2: 25 mmol/L (ref 22–32)

## 2023-03-09 SURGERY — EXAM UNDER ANESTHESIA
Anesthesia: Monitor Anesthesia Care | Site: Anus

## 2023-03-09 MED ORDER — LIDOCAINE HCL (PF) 2 % IJ SOLN
INTRAMUSCULAR | Status: AC
  Filled 2023-03-09: qty 5

## 2023-03-09 MED ORDER — ONDANSETRON HCL 4 MG/2ML IJ SOLN: 4.0000 mg | Freq: Once | INTRAMUSCULAR | Status: DC | PRN

## 2023-03-09 MED ORDER — ROCURONIUM BROMIDE 10 MG/ML (PF) SYRINGE
PREFILLED_SYRINGE | INTRAVENOUS | Status: AC
  Filled 2023-03-09: qty 10

## 2023-03-09 MED ORDER — LIDOCAINE 2% (20 MG/ML) 5 ML SYRINGE
INTRAMUSCULAR | Status: DC | PRN
  Administered 2023-03-09: 50 mg via INTRAVENOUS

## 2023-03-09 MED ORDER — ACETAMINOPHEN 500 MG PO TABS
1000.0000 mg | ORAL_TABLET | ORAL | Status: AC
  Administered 2023-03-09: 1000 mg via ORAL

## 2023-03-09 MED ORDER — OXYCODONE HCL 5 MG/5ML PO SOLN: 5.0000 mg | Freq: Once | ORAL | Status: DC | PRN

## 2023-03-09 MED ORDER — 0.9 % SODIUM CHLORIDE (POUR BTL) OPTIME
TOPICAL | Status: DC | PRN
  Administered 2023-03-09: 500 mL

## 2023-03-09 MED ORDER — FENTANYL CITRATE (PF) 100 MCG/2ML IJ SOLN
INTRAMUSCULAR | Status: DC | PRN
  Administered 2023-03-09: 25 ug via INTRAVENOUS

## 2023-03-09 MED ORDER — ACETAMINOPHEN 500 MG PO TABS
ORAL_TABLET | ORAL | Status: AC
  Filled 2023-03-09: qty 2

## 2023-03-09 MED ORDER — SODIUM CHLORIDE 0.9% FLUSH: 3.0000 mL | Freq: Two times a day (BID) | INTRAVENOUS | Status: DC

## 2023-03-09 MED ORDER — MIDAZOLAM HCL 2 MG/2ML IJ SOLN
INTRAMUSCULAR | Status: DC | PRN
  Administered 2023-03-09: 2 mg via INTRAVENOUS

## 2023-03-09 MED ORDER — FENTANYL CITRATE (PF) 100 MCG/2ML IJ SOLN: 25.0000 ug | INTRAMUSCULAR | Status: DC | PRN

## 2023-03-09 MED ORDER — SUGAMMADEX SODIUM 200 MG/2ML IV SOLN
INTRAVENOUS | Status: DC | PRN
  Administered 2023-03-09: 200 mg via INTRAVENOUS

## 2023-03-09 MED ORDER — MIDAZOLAM HCL 2 MG/2ML IJ SOLN
INTRAMUSCULAR | Status: AC
  Filled 2023-03-09: qty 2

## 2023-03-09 MED ORDER — OXYCODONE HCL 5 MG PO TABS: 5.0000 mg | ORAL_TABLET | Freq: Once | ORAL | Status: DC | PRN

## 2023-03-09 MED ORDER — ACETAMINOPHEN 10 MG/ML IV SOLN: 1000.0000 mg | Freq: Once | INTRAVENOUS | Status: DC | PRN

## 2023-03-09 MED ORDER — CELECOXIB 200 MG PO CAPS
200.0000 mg | ORAL_CAPSULE | ORAL | Status: AC
  Administered 2023-03-09: 200 mg via ORAL

## 2023-03-09 MED ORDER — CELECOXIB 200 MG PO CAPS
ORAL_CAPSULE | ORAL | Status: AC
  Filled 2023-03-09: qty 1

## 2023-03-09 MED ORDER — BUPIVACAINE-EPINEPHRINE 0.25% -1:200000 IJ SOLN
INTRAMUSCULAR | Status: DC | PRN
  Administered 2023-03-09: 30 mL

## 2023-03-09 MED ORDER — PROPOFOL 500 MG/50ML IV EMUL
INTRAVENOUS | Status: DC | PRN
  Administered 2023-03-09: 100 ug/kg/min via INTRAVENOUS

## 2023-03-09 MED ORDER — LACTATED RINGERS IV SOLN
INTRAVENOUS | Status: DC | PRN
Start: 2023-03-09 — End: 2023-03-09

## 2023-03-09 MED ORDER — ROCURONIUM BROMIDE 10 MG/ML (PF) SYRINGE
PREFILLED_SYRINGE | INTRAVENOUS | Status: DC | PRN
  Administered 2023-03-09: 20 mg via INTRAVENOUS

## 2023-03-09 MED ORDER — MIDAZOLAM HCL 2 MG/2ML IJ SOLN: 1.0000 mg | INTRAMUSCULAR | Status: DC | PRN

## 2023-03-09 MED ORDER — TRAMADOL HCL 50 MG PO TABS: 50.0000 mg | ORAL_TABLET | Freq: Four times a day (QID) | ORAL | 0 refills | Status: AC | PRN

## 2023-03-09 MED ORDER — FENTANYL CITRATE (PF) 100 MCG/2ML IJ SOLN
INTRAMUSCULAR | Status: AC
  Filled 2023-03-09: qty 2

## 2023-03-09 MED ORDER — PROPOFOL 10 MG/ML IV BOLUS
INTRAVENOUS | Status: DC | PRN
Start: 2023-03-09 — End: 2023-03-09
  Administered 2023-03-09: 100 mg via INTRAVENOUS

## 2023-03-09 SURGICAL SUPPLY — 55 items
APL SKNCLS STERI-STRIP NONHPOA (GAUZE/BANDAGES/DRESSINGS) ×1
BENZOIN TINCTURE PRP APPL 2/3 (GAUZE/BANDAGES/DRESSINGS) ×1 IMPLANT
BLADE EXTENDED COATED 6.5IN (ELECTRODE) IMPLANT
BLADE SURG 10 STRL SS (BLADE) IMPLANT
BRIEF MESH DISP LRG (UNDERPADS AND DIAPERS) ×1 IMPLANT
COVER BACK TABLE 60X90IN (DRAPES) ×1 IMPLANT
COVER MAYO STAND STRL (DRAPES) ×1 IMPLANT
DRAPE HYSTEROSCOPY (MISCELLANEOUS) IMPLANT
DRAPE LAPAROTOMY 100X72 PEDS (DRAPES) ×1 IMPLANT
DRAPE SHEET LG 3/4 BI-LAMINATE (DRAPES) IMPLANT
DRAPE UTILITY XL STRL (DRAPES) ×1 IMPLANT
ELECT REM PT RETURN 9FT ADLT (ELECTROSURGICAL) ×1
ELECTRODE REM PT RTRN 9FT ADLT (ELECTROSURGICAL) ×1 IMPLANT
GAUZE 4X4 16PLY ~~LOC~~+RFID DBL (SPONGE) ×1 IMPLANT
GAUZE PAD ABD 8X10 STRL (GAUZE/BANDAGES/DRESSINGS) ×1 IMPLANT
GAUZE SPONGE 4X4 12PLY STRL (GAUZE/BANDAGES/DRESSINGS) IMPLANT
GLOVE BIO SURGEON STRL SZ 6.5 (GLOVE) ×1 IMPLANT
GLOVE BIOGEL PI IND STRL 7.0 (GLOVE) ×1 IMPLANT
GLOVE INDICATOR 6.5 STRL GRN (GLOVE) ×1 IMPLANT
GOWN STRL REUS W/TWL XL LVL3 (GOWN DISPOSABLE) ×1 IMPLANT
HYDROGEN PEROXIDE 16OZ (MISCELLANEOUS) IMPLANT
IV CATH 14GX2 1/4 (CATHETERS) IMPLANT
IV CATH 18G SAFETY (IV SOLUTION) IMPLANT
KIT SIGMOIDOSCOPE (SET/KITS/TRAYS/PACK) IMPLANT
KIT TURNOVER CYSTO (KITS) ×1 IMPLANT
LEGGING LITHOTOMY PAIR STRL (DRAPES) IMPLANT
LOOP VASCLR MAXI BLUE 18IN ST (MISCELLANEOUS) IMPLANT
LOOP VASCULAR MAXI 18 BLUE (MISCELLANEOUS)
LOOPS VASCLR MAXI BLUE 18IN ST (MISCELLANEOUS) IMPLANT
NDL HYPO 22X1.5 SAFETY MO (MISCELLANEOUS) ×1 IMPLANT
NEEDLE HYPO 22X1.5 SAFETY MO (MISCELLANEOUS) ×1 IMPLANT
NS IRRIG 500ML POUR BTL (IV SOLUTION) ×1 IMPLANT
PACK BASIN DAY SURGERY FS (CUSTOM PROCEDURE TRAY) ×1 IMPLANT
PAD ARMBOARD 7.5X6 YLW CONV (MISCELLANEOUS) ×1 IMPLANT
PAD PREP 24X48 CUFFED NSTRL (MISCELLANEOUS) IMPLANT
PENCIL SMOKE EVACUATOR (MISCELLANEOUS) ×1 IMPLANT
SLEEVE SCD COMPRESS KNEE MED (STOCKING) ×1 IMPLANT
SPONGE HEMORRHOID 8X3CM (HEMOSTASIS) IMPLANT
SPONGE SURGIFOAM ABS GEL 100 (HEMOSTASIS) IMPLANT
SPONGE SURGIFOAM ABS GEL 12-7 (HEMOSTASIS) IMPLANT
SUT CHROMIC 2 0 SH (SUTURE) IMPLANT
SUT CHROMIC 3 0 SH 27 (SUTURE) IMPLANT
SUT ETHIBOND 0 (SUTURE) IMPLANT
SUT VIC AB 2-0 SH 27 (SUTURE)
SUT VIC AB 2-0 SH 27XBRD (SUTURE) IMPLANT
SUT VIC AB 3-0 SH 27 (SUTURE)
SUT VIC AB 3-0 SH 27XBRD (SUTURE) IMPLANT
SYR 10ML LL (SYRINGE) IMPLANT
SYR CONTROL 10ML LL (SYRINGE) ×1 IMPLANT
TOWEL OR 17X24 6PK STRL BLUE (TOWEL DISPOSABLE) ×1 IMPLANT
TRAY DSU PREP LF (CUSTOM PROCEDURE TRAY) ×1 IMPLANT
TUBE CONNECTING 12X1/4 (SUCTIONS) ×1 IMPLANT
UNDERPAD 30X36 HEAVY ABSORB (UNDERPADS AND DIAPERS) ×1 IMPLANT
VASCULAR TIE MAXI BLUE 18IN ST (MISCELLANEOUS)
YANKAUER SUCT BULB TIP NO VENT (SUCTIONS) ×1 IMPLANT

## 2023-03-09 NOTE — Anesthesia Procedure Notes (Signed)
Procedure Name: LMA Insertion Date/Time: 03/09/2023 8:56 AM  Performed by: Francie Massing, CRNAPre-anesthesia Checklist: Patient identified, Emergency Drugs available, Suction available and Patient being monitored Patient Re-evaluated:Patient Re-evaluated prior to induction Oxygen Delivery Method: Circle system utilized Preoxygenation: Pre-oxygenation with 100% oxygen Induction Type: IV induction Ventilation: Mask ventilation without difficulty LMA: LMA inserted LMA Size: 4.0 Number of attempts: 1 Airway Equipment and Method: Bite block Placement Confirmation: positive ETCO2 Tube secured with: Tape Dental Injury: Teeth and Oropharynx as per pre-operative assessment

## 2023-03-09 NOTE — H&P (Addendum)
EFERRING PHYSICIAN:  Ruffin Frederick*   PROVIDER:  Elenora Gamma, MD   MRN: [id number] DOB: Mar 04, 1966 DATE OF ENCOUNTER: 01/09/2023   Subjective    Chief Complaint: New Consultation ( Eval of AIN)       History of Present Illness: Gabriel Mccann is a 57 y.o. male who is seen today as an office consultation at the request of Dr. Adela Lank for evaluation of New Consultation ( Eval of AIN) .  57 year old male who underwent recent colonoscopy.  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     Review of Systems: A complete review of systems was obtained from the patient.  I have reviewed this information and discussed as appropriate with the patient.  See HPI as well for other ROS.       Medical History: Past Medical History  History reviewed. No pertinent past medical history.        Problem List  There is no problem list on file for this patient.      Past Surgical History  History reviewed. No pertinent surgical history.      Allergies  No Known Allergies      Medications Ordered Prior to Encounter             Current Outpatient Medications on File Prior to Visit  Medication Sig Dispense Refill   amLODIPine (NORVASC) 10 MG tablet Take 1 tablet by mouth once daily        No current facility-administered medications on file prior to visit.        Family History  History reviewed. No pertinent family history.      Tobacco Use History  Social History         Tobacco Use  Smoking Status Not on file  Smokeless Tobacco Not on file        Social History  Social History           Socioeconomic History   Marital status: Single    Social Determinants of Health           Financial Resource Strain: Low Risk  (11/25/2021)    Received from [facility]    Overall Financial Resource Strain (CARDIA)     Difficulty of Paying Living Expenses: Not hard at all  Food Insecurity: No Food Insecurity  (11/25/2021)    Received from [facility]    Hunger Vital Sign     Worried About Running Out of Food in the Last Year: Never true     Ran Out of Food in the Last Year: Never true  Transportation Needs: No Transportation Needs (11/25/2021)    Received from [facility] - Transportation     Lack of Transportation (Medical): No     Lack of Transportation (Non-Medical): No  Physical Activity: Sufficiently Active (11/25/2021)    Received from [facility]    Exercise Vital Sign     Days of Exercise per Week: 5 days     Minutes of Exercise per Session: 150+ min  Stress: No Stress Concern Present (11/25/2021)    Received from [facility] of Occupational Health - Occupational Stress Questionnaire     Feeling of Stress : Not at all  Social Connections: Moderately Integrated (11/25/2021)    Received from [facility]    Social Connection and Isolation Panel [NHANES]     Frequency of Communication with Friends and Family: More than  three times a week     Frequency of Social Gatherings with Friends and Family: More than three times a week     Attends Religious Services: More than 4 times per year     Active Member of Clubs or Organizations: Yes     Attends Engineer, structural: More than 4 times per year     Marital Status: Never married        Objective:      Vitals:   03/09/23 0728  BP: 116/78  Pulse: 64  Resp: 16  Temp: 97.6 F (36.4 C)  SpO2: 100%      Exam Gen: NAD CV: RRR Lungs: CTA Abd: soft       Labs, Imaging and Diagnostic Testing: Colonoscopy report and pictures reviewed   Assessment and Plan:  Diagnoses and all orders for this visit:   AIN (anal intraepithelial neoplasia) anal canal     57 year old male with hypertrophied anal papilla seen on colonoscopy and biopsy does show a grade 1.  We discussed proceeding with resection versus active surveillance in the office.  Patient elected to undergo resection.  Risks include  bleeding, pain and a small chance of recurrence.  All questions answered.   [name], MD Colon and Rectal Surgery [facility]

## 2023-03-09 NOTE — Discharge Instructions (Addendum)
Beginning the day after surgery:  You may sit in a tub of warm water 2-3 times a day to relieve discomfort.  Eat a regular diet high in fiber.  Avoid foods that give you constipation or diarrhea.  Avoid foods that are difficult to digest, such as seeds, nuts, corn or popcorn.  Do not go any longer than 2 days without a bowel movement.  You may take a dose of Milk of Magnesia if you become constipated.    Drink 6-8 glasses of water daily.  Walking is encouraged.  Avoid strenuous activity and heavy lifting for one month after surgery.    Call the office if you have any questions or concerns.  Call immediately if you develop:  Excessive rectal bleeding (more than a cup or passing large clots) Increased discomfort Fever greater than 100 F Difficulty urinating          No acetaminophen/Tylenol until after 1:40 pm today if needed.  No ibuprofen, Advil, Aleve, Motrin, ketorolac, meloxicam, naproxen, or other NSAIDS until after 1:40 pm today if needed.   Post Anesthesia Home Care Instructions  Activity: Get plenty of rest for the remainder of the day. A responsible individual must stay with you for 24 hours following the procedure.  For the next 24 hours, DO NOT: -Drive a car -Advertising copywriter -Drink alcoholic beverages -Take any medication unless instructed by your physician -Make any legal decisions or sign important papers.  Meals: Start with liquid foods such as gelatin or soup. Progress to regular foods as tolerated. Avoid greasy, spicy, heavy foods. If nausea and/or vomiting occur, drink only clear liquids until the nausea and/or vomiting subsides. Call your physician if vomiting continues.  Special Instructions/Symptoms: Your throat may feel dry or sore from the anesthesia or the breathing tube placed in your throat during surgery. If this causes discomfort, gargle with warm salt water. The discomfort should disappear within 24 hours.

## 2023-03-09 NOTE — Anesthesia Preprocedure Evaluation (Addendum)
Anesthesia Evaluation  Patient identified by MRN, date of birth, ID band Patient awake    Reviewed: Allergy & Precautions, NPO status , Patient's Chart, lab work & pertinent test results, reviewed documented beta blocker date and time   History of Anesthesia Complications Negative for: history of anesthetic complications  Airway Mallampati: II  TM Distance: >3 FB Neck ROM: Full    Dental  (+) Upper Dentures   Pulmonary neg COPD, Patient abstained from smoking.   breath sounds clear to auscultation       Cardiovascular hypertension, (-) angina (-) CAD, (-) Past MI and (-) CHF (-) Cardiac Defibrillator (-) Valvular Problems/Murmurs Rhythm:Regular Rate:Normal     Neuro/Psych neg Headaches, neg Seizures  Anxiety        GI/Hepatic ,neg GERD  ,,(+) neg Cirrhosis        Endo/Other    Renal/GU Renal disease     Musculoskeletal   Abdominal   Peds  Hematology   Anesthesia Other Findings   Reproductive/Obstetrics                              Anesthesia Physical Anesthesia Plan  ASA: 2  Anesthesia Plan: MAC   Post-op Pain Management:    Induction: Intravenous  PONV Risk Score and Plan:   Airway Management Planned:   Additional Equipment:   Intra-op Plan:   Post-operative Plan:   Informed Consent:      Dental advisory given  Plan Discussed with: CRNA and Surgeon  Anesthesia Plan Comments:          Anesthesia Quick Evaluation

## 2023-03-09 NOTE — Anesthesia Postprocedure Evaluation (Signed)
Anesthesia Post Note  Patient: Gabriel Mccann  Procedure(s) Performed: EXAM UNDER ANESTHESIA, EXCISION OF ANAL PAPILLA (Anus)     Patient location during evaluation: PACU Anesthesia Type: MAC Level of consciousness: awake and alert Pain management: pain level controlled Vital Signs Assessment: post-procedure vital signs reviewed and stable Respiratory status: spontaneous breathing, nonlabored ventilation, respiratory function stable and patient connected to nasal cannula oxygen Cardiovascular status: blood pressure returned to baseline and stable Postop Assessment: no apparent nausea or vomiting Anesthetic complications: no   No notable events documented.  Last Vitals:  Vitals:   03/09/23 0930 03/09/23 0945  BP: 110/64 114/74  Pulse: 66 63  Resp: 16 11  Temp:    SpO2: 100% 98%    Last Pain:  Vitals:   03/09/23 0945  TempSrc:   PainSc: 0-No pain                 Mariann Barter

## 2023-03-09 NOTE — Transfer of Care (Signed)
Immediate Anesthesia Transfer of Care Note  Patient: Gabriel Mccann  Procedure(s) Performed: Procedure(s) (LRB): EXAM UNDER ANESTHESIA, EXCISION OF ANAL PAPILLA (N/A)  Patient Location: PACU  Anesthesia Type: General  Level of Consciousness: awake, oriented, sedated and patient cooperative  Airway & Oxygen Therapy: Patient Spontanous Breathing and Patient connected to face mask oxygen  Post-op Assessment: Report given to PACU RN and Post -op Vital signs reviewed and stable  Post vital signs: Reviewed and stable  Complications: No apparent anesthesia complications Complications: No apparent anesthesia complications Last Vitals:  Vitals Value Taken Time  BP 103/66 03/09/23 0923  Temp    Pulse 66 03/09/23 0925  Resp 15 03/09/23 0925  SpO2 100 % 03/09/23 0925  Vitals shown include unfiled device data.  Last Pain:  Vitals:   03/09/23 0728  TempSrc: Oral  PainSc: 0-No pain         Complications: No notable events documented.

## 2023-03-09 NOTE — Op Note (Signed)
03/09/2023  9:09 AM  PATIENT:  Gabriel Mccann  57 y.o. male  Patient Care Team: Grayce Sessions, NP as PCP - General (Internal Medicine)  PRE-OPERATIVE DIAGNOSIS:  AIN1  POST-OPERATIVE DIAGNOSIS:  AIN1  PROCEDURE:  EXAM UNDER ANESTHESIA, EXCISION OF ANAL PAPILLA    Surgeon(s): Romie Levee, MD  ASSISTANT: none   ANESTHESIA:   local and MAC  SPECIMEN:  Source of Specimen:  anal papilla  DISPOSITION OF SPECIMEN:  PATHOLOGY  COUNTS:  YES  PLAN OF CARE: Discharge to home after PACU  PATIENT DISPOSITION:  PACU - hemodynamically stable.  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  MAC anesthesia was induced without difficulty. The patient was then positioned in lithotomy position with buttocks gently taped apart.  The patient was then prepped and draped in usual sterile fashion.  SCDs were noted to be in place prior to the initiation of anesthesia. A surgical timeout was performed indicating the correct patient, procedure, positioning and need for preoperative antibiotics.  A rectal block was performed using Marcaine with epinephrine.    I began with a digital rectal exam.  The patient had some moderate sphincter hypertension.  I then placed a small Hill-Ferguson anoscope into the anal canal and evaluated this completely.  The papilla was noted at posterior midline.  There were no other lesions noted within the anal canal.  The papilla was excised using Metzenbaum scissors.  The incision was closed using a figure-of-eight 2-0 chromic suture.  Additional Marcaine was placed around the incision site for postoperative pain control and hemostasis.  A dressing was applied and the patient was awakened from anesthesia and sent to the postanesthesia care unit in stable condition.   All counts were correct by operating room staff.  [name], MD  Colorectal and General Surgery [facility]

## 2023-04-13 ENCOUNTER — Ambulatory Visit (INDEPENDENT_AMBULATORY_CARE_PROVIDER_SITE_OTHER): Payer: 59 | Admitting: Primary Care

## 2023-04-23 ENCOUNTER — Telehealth (INDEPENDENT_AMBULATORY_CARE_PROVIDER_SITE_OTHER): Payer: Self-pay | Admitting: Primary Care

## 2023-04-23 ENCOUNTER — Telehealth (INDEPENDENT_AMBULATORY_CARE_PROVIDER_SITE_OTHER): Payer: 59 | Admitting: Primary Care

## 2023-04-23 NOTE — Telephone Encounter (Signed)
Spoke to pt about changing apt time to 330p and making a telephone visit so that he could still be seen.

## 2023-04-24 ENCOUNTER — Ambulatory Visit (INDEPENDENT_AMBULATORY_CARE_PROVIDER_SITE_OTHER): Payer: 59 | Admitting: Primary Care

## 2023-05-04 ENCOUNTER — Ambulatory Visit (INDEPENDENT_AMBULATORY_CARE_PROVIDER_SITE_OTHER): Payer: 59 | Admitting: Primary Care

## 2023-05-04 ENCOUNTER — Other Ambulatory Visit (INDEPENDENT_AMBULATORY_CARE_PROVIDER_SITE_OTHER): Payer: Self-pay

## 2023-05-04 ENCOUNTER — Other Ambulatory Visit (INDEPENDENT_AMBULATORY_CARE_PROVIDER_SITE_OTHER): Payer: Self-pay | Admitting: Primary Care

## 2023-05-04 MED ORDER — ROSUVASTATIN CALCIUM 40 MG PO TABS: 40.0000 mg | ORAL_TABLET | Freq: Every day | ORAL | 0 refills | Status: DC

## 2023-05-07 ENCOUNTER — Ambulatory Visit (INDEPENDENT_AMBULATORY_CARE_PROVIDER_SITE_OTHER): Payer: Self-pay

## 2023-05-07 ENCOUNTER — Encounter (INDEPENDENT_AMBULATORY_CARE_PROVIDER_SITE_OTHER): Payer: Self-pay

## 2023-05-07 DIAGNOSIS — Z760 Encounter for issue of repeat prescription: Secondary | ICD-10-CM

## 2023-05-07 DIAGNOSIS — I10 Essential (primary) hypertension: Secondary | ICD-10-CM

## 2023-05-07 MED ORDER — HYDROCHLOROTHIAZIDE 25 MG PO TABS: 25.0000 mg | ORAL_TABLET | Freq: Every day | ORAL | 0 refills | Status: DC

## 2023-05-07 MED ORDER — AMLODIPINE BESYLATE 10 MG PO TABS: 10.0000 mg | ORAL_TABLET | Freq: Every day | ORAL | 1 refills | Status: DC

## 2023-05-07 MED ORDER — FENOFIBRATE 145 MG PO TABS: 145.0000 mg | ORAL_TABLET | Freq: Every day | ORAL | 0 refills | Status: DC

## 2023-05-07 NOTE — Telephone Encounter (Signed)
Patient given 30 day supply of BP and cholesterol meds. Xanax is not on patient profile. Patient aware that he needs OV for further refills VV for 05/08/2023.

## 2023-05-07 NOTE — Addendum Note (Signed)
Addended by: Elsie Lincoln F on: 05/07/2023 09:33 AM   Modules accepted: Orders

## 2023-05-07 NOTE — Telephone Encounter (Signed)
Tried reaching out to pt to make aware that appointment will need to be in person because he is due for lab work... Pt didn't answer. Sent pt a MyChart message making him aware that appointment will need be in person and if he has any questions or concerns to give [place] a call

## 2023-05-07 NOTE — Telephone Encounter (Signed)
  Chief Complaint: pt c/o medication not being refilled and refused by provider Symptoms: anxiety, anger, argumentative, confrontational  Disposition: [] ED /[] Urgent Care (no appt availability in office) / [] Appointment(In office/virtual)/ []  [facility]/ [] Home Care/ [] Refused Recommended Disposition /[] [facility]/ []  Follow-up with PCP Additional Notes: Pt stated why are ya'll refusing my medication? Marcelino Duster wanting me to die? Ya'll trying to kill me. I have been buying my medication off the black market and stated he passed out Saturday. Pt very angry and would not let this NT get any words in" advised pt he was a no show for last few appts. Pt stated "why do I have to come in to see me ? I have been taking this medication for a long time! That doesn't make any sense! I hope this call is being recorded." Advised pt according to his medication list he has refills remaining on amlodipine, fenofibrate and hydrochlorothiazide. Advised pt as much and advised him will call [facility] and inquire on these medications. Also made pt a [facility] video visit. Pt stated he cannot come in due to disability. At end of call pt apologized for his behavior and thanked this NT. [employer] and pharmacy is closed until 0900. LM on the pharmacy VM to call back to our office and verify the refills. Called pt back and got recording that call could not be completed.   Reason for Disposition  Patient has refills remaining on their prescription  Answer Assessment - Initial Assessment Questions 1. DRUG NAME: "What medicine do you need to have refilled?"     Xanax also asking about BP and cholesterol medications 2. REFILLS REMAINING: "How many refills are remaining?" (Note: The label on the medicine or pill bottle will show how many refills are remaining. If there are no refills remaining, then a renewal may be needed.)     None for xanax but there should be for hydrochlorothiazide,  amlodipine and fenofibrate. 4. PRESCRIBING HCP: "Who prescribed it?" Reason: If prescribed by specialist, call should be referred to that group.     PCP 5. SYMPTOMS: "Do you have any symptoms?"     anxiety  Protocols used: Medication Refill and Renewal Call-A-AH

## 2023-05-07 NOTE — Telephone Encounter (Signed)
Called pt and LM on home VM about med refill.

## 2023-05-08 ENCOUNTER — Telehealth (INDEPENDENT_AMBULATORY_CARE_PROVIDER_SITE_OTHER): Payer: 59 | Admitting: Primary Care

## 2023-05-08 NOTE — Telephone Encounter (Signed)
Patient did not show for appointment today. Call placed to patient to Notify him that PCP is requiring that he his office visit for assessment and Labs before he pick up his medication and to reschedule appointment and advise that Approved Medication refills have been placed on hold until patient attends OV with PCP. Unable to reach patient message left.

## 2023-05-09 ENCOUNTER — Telehealth (INDEPENDENT_AMBULATORY_CARE_PROVIDER_SITE_OTHER): Payer: Self-pay | Admitting: Primary Care

## 2023-05-09 NOTE — Telephone Encounter (Signed)
Tried calling again; could not reach pt.

## 2023-05-09 NOTE — Telephone Encounter (Signed)
Can you please provide transportation to this appointment?  Pt states his ride can only come in the morning. If you can provide this transportation, this will be a one time only deal, he understands that.  He can get scat w/ a 3 day notice.  (He did not tell me he could not come in the afternoon). The address on file is his mailing address.  His home address is:  Home address: [address] [place]) [place], [place] [address]    Thank you very much!

## 2023-05-09 NOTE — Telephone Encounter (Signed)
Contacted pt on all pt contacts to confirm transportation request; could not reach and could not leave vm. Will try again later

## 2023-05-10 ENCOUNTER — Telehealth (INDEPENDENT_AMBULATORY_CARE_PROVIDER_SITE_OTHER): Payer: Self-pay | Admitting: Primary Care

## 2023-05-10 ENCOUNTER — Ambulatory Visit (INDEPENDENT_AMBULATORY_CARE_PROVIDER_SITE_OTHER): Payer: 59 | Admitting: Primary Care

## 2023-05-10 ENCOUNTER — Telehealth: Payer: Self-pay | Admitting: Primary Care

## 2023-05-10 NOTE — Telephone Encounter (Signed)
Patient called because his transportation did not pick him up for his appt. He was very upset as this was the 4th time he had rescheduled his appt. I advised patient that several calls were made to confirm his transportation and patient said he does not answer calls he don't know. Please f/u with patient as he refused to reschedule with me.

## 2023-05-10 NOTE — Telephone Encounter (Signed)
Copied from CRM [id number]. Topic: General - Other >> May 10, 2023  2:06 PM Clide Dales wrote: Patient is requesting a callback from provider. Please advise.

## 2023-05-22 ENCOUNTER — Ambulatory Visit (INDEPENDENT_AMBULATORY_CARE_PROVIDER_SITE_OTHER): Payer: Medicare Other | Admitting: Primary Care

## 2023-05-22 ENCOUNTER — Encounter (INDEPENDENT_AMBULATORY_CARE_PROVIDER_SITE_OTHER): Payer: Self-pay | Admitting: Primary Care

## 2023-05-22 VITALS — BP 116/75 | HR 67 | Resp 16 | Wt 151.6 lb

## 2023-05-22 DIAGNOSIS — I10 Essential (primary) hypertension: Secondary | ICD-10-CM | POA: Diagnosis not present

## 2023-05-22 DIAGNOSIS — E782 Mixed hyperlipidemia: Secondary | ICD-10-CM

## 2023-05-22 DIAGNOSIS — Z2821 Immunization not carried out because of patient refusal: Secondary | ICD-10-CM

## 2023-05-22 DIAGNOSIS — Z79899 Other long term (current) drug therapy: Secondary | ICD-10-CM

## 2023-05-22 DIAGNOSIS — F411 Generalized anxiety disorder: Secondary | ICD-10-CM | POA: Diagnosis not present

## 2023-05-22 DIAGNOSIS — Z760 Encounter for issue of repeat prescription: Secondary | ICD-10-CM

## 2023-05-22 MED ORDER — AMLODIPINE BESYLATE 10 MG PO TABS: 10.0000 mg | ORAL_TABLET | Freq: Every day | ORAL | 1 refills | Status: AC

## 2023-05-22 MED ORDER — HYDROCHLOROTHIAZIDE 25 MG PO TABS: 25.0000 mg | ORAL_TABLET | Freq: Every day | ORAL | 1 refills | Status: AC

## 2023-05-22 NOTE — Addendum Note (Signed)
Addended by: Gwinda Passe on: 05/22/2023 09:18 AM   Modules accepted: Orders
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
## 2023-05-23 LAB — CMP14+EGFR
ALT: 51 [IU]/L — ABNORMAL HIGH (ref 0–44)
AST: 79 [IU]/L — ABNORMAL HIGH (ref 0–40)
Albumin: 4.3 g/dL (ref 3.8–4.9)
Alkaline Phosphatase: 140 [IU]/L — ABNORMAL HIGH (ref 44–121)
BUN/Creatinine Ratio: 10 (ref 9–20)
BUN: 8 mg/dL (ref 6–24)
Bilirubin Total: 0.4 mg/dL (ref 0.0–1.2)
CO2: 24 mmol/L (ref 20–29)
Calcium: 9.5 mg/dL (ref 8.7–10.2)
Chloride: 97 mmol/L (ref 96–106)
Creatinine, Ser: 0.79 mg/dL (ref 0.76–1.27)
Globulin, Total: 2.5 g/dL (ref 1.5–4.5)
Glucose: 79 mg/dL (ref 70–99)
Potassium: 3.8 mmol/L (ref 3.5–5.2)
Sodium: 137 mmol/L (ref 134–144)
Total Protein: 6.8 g/dL (ref 6.0–8.5)
eGFR: 104 mL/min/{1.73_m2} (ref >59)

## 2023-05-23 LAB — LIPID PANEL
Chol/HDL Ratio: 2 ratio (ref 0.0–5.0)
Cholesterol, Total: 212 mg/dL — ABNORMAL HIGH (ref 100–199)
HDL: 107 mg/dL (ref >39)
LDL Chol Calc (NIH): 87 mg/dL (ref 0–99)
Triglycerides: 106 mg/dL (ref 0–149)
VLDL Cholesterol Cal: 18 mg/dL (ref 5–40)

## 2023-06-04 ENCOUNTER — Other Ambulatory Visit (INDEPENDENT_AMBULATORY_CARE_PROVIDER_SITE_OTHER): Payer: Self-pay | Admitting: Primary Care

## 2023-06-04 ENCOUNTER — Telehealth (INDEPENDENT_AMBULATORY_CARE_PROVIDER_SITE_OTHER): Payer: Self-pay | Admitting: Primary Care

## 2023-06-04 MED ORDER — ROSUVASTATIN CALCIUM 10 MG PO TABS: 10.0000 mg | ORAL_TABLET | Freq: Every day | ORAL | 1 refills | Status: DC

## 2023-06-04 NOTE — Telephone Encounter (Signed)
Called pt to confirm apt. Pt will be present.

## 2023-06-05 ENCOUNTER — Ambulatory Visit (INDEPENDENT_AMBULATORY_CARE_PROVIDER_SITE_OTHER): Payer: Medicare Other | Admitting: Primary Care

## 2023-07-04 ENCOUNTER — Telehealth (INDEPENDENT_AMBULATORY_CARE_PROVIDER_SITE_OTHER): Payer: Self-pay | Admitting: Primary Care

## 2023-07-04 NOTE — Telephone Encounter (Signed)
Called pt to advise him on apt. Please give me a call back at [facility].

## 2023-07-05 ENCOUNTER — Ambulatory Visit (INDEPENDENT_AMBULATORY_CARE_PROVIDER_SITE_OTHER): Payer: 59 | Admitting: Primary Care

## 2023-09-05 ENCOUNTER — Ambulatory Visit (INDEPENDENT_AMBULATORY_CARE_PROVIDER_SITE_OTHER): Payer: 59 | Admitting: Primary Care

## 2023-09-05 ENCOUNTER — Encounter (INDEPENDENT_AMBULATORY_CARE_PROVIDER_SITE_OTHER): Payer: Self-pay | Admitting: Primary Care

## 2023-09-05 VITALS — BP 130/82 | HR 79 | Resp 16 | Ht 68.0 in | Wt 149.8 lb

## 2023-09-05 DIAGNOSIS — R1084 Generalized abdominal pain: Secondary | ICD-10-CM

## 2023-09-05 DIAGNOSIS — I10 Essential (primary) hypertension: Secondary | ICD-10-CM | POA: Diagnosis not present

## 2023-09-05 DIAGNOSIS — E782 Mixed hyperlipidemia: Secondary | ICD-10-CM
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
## 2023-09-06 LAB — CMP14+EGFR
ALT: 70 [IU]/L — ABNORMAL HIGH (ref 0–44)
AST: 253 [IU]/L — ABNORMAL HIGH (ref 0–40)
Albumin: 4.5 g/dL (ref 3.8–4.9)
Alkaline Phosphatase: 88 [IU]/L (ref 44–121)
BUN/Creatinine Ratio: 19 (ref 9–20)
BUN: 19 mg/dL (ref 6–24)
Bilirubin Total: 0.4 mg/dL (ref 0.0–1.2)
CO2: 21 mmol/L (ref 20–29)
Calcium: 9.5 mg/dL (ref 8.7–10.2)
Chloride: 99 mmol/L (ref 96–106)
Creatinine, Ser: 1.01 mg/dL (ref 0.76–1.27)
Globulin, Total: 2.5 g/dL (ref 1.5–4.5)
Glucose: 82 mg/dL (ref 70–99)
Potassium: 4.1 mmol/L (ref 3.5–5.2)
Sodium: 141 mmol/L (ref 134–144)
Total Protein: 7 g/dL (ref 6.0–8.5)
eGFR: 87 mL/min/{1.73_m2} (ref >59)

## 2023-09-06 LAB — LIPID PANEL
Chol/HDL Ratio: 3 {ratio} (ref 0.0–5.0)
Cholesterol, Total: 142 mg/dL (ref 100–199)
HDL: 47 mg/dL (ref >39)
LDL Chol Calc (NIH): 46 mg/dL (ref 0–99)
Triglycerides: 330 mg/dL — ABNORMAL HIGH (ref 0–149)
VLDL Cholesterol Cal: 49 mg/dL — ABNORMAL HIGH (ref 5–40)

## 2023-09-06 LAB — CBC WITH DIFFERENTIAL/PLATELET
Basophils Absolute: 0.1 10*3/uL (ref 0.0–0.2)
Basos: 1 %
EOS (ABSOLUTE): 0 10*3/uL (ref 0.0–0.4)
Eos: 0 %
Hematocrit: 40.2 % (ref 37.5–51.0)
Hemoglobin: 13.3 g/dL (ref 13.0–17.7)
Immature Grans (Abs): 0 10*3/uL (ref 0.0–0.1)
Immature Granulocytes: 0 %
Lymphocytes Absolute: 1.1 10*3/uL (ref 0.7–3.1)
Lymphs: 13 %
MCH: 34.5 pg — ABNORMAL HIGH (ref 26.6–33.0)
MCHC: 33.1 g/dL (ref 31.5–35.7)
MCV: 104 fL — ABNORMAL HIGH (ref 79–97)
Monocytes Absolute: 0.7 10*3/uL (ref 0.1–0.9)
Monocytes: 9 %
Neutrophils Absolute: 6.5 10*3/uL (ref 1.4–7.0)
Neutrophils: 77 %
Platelets: 466 10*3/uL — ABNORMAL HIGH (ref 150–450)
RBC: 3.86 x10E6/uL — ABNORMAL LOW (ref 4.14–5.80)
RDW: 13 % (ref 11.6–15.4)
WBC: 8.5 10*3/uL (ref 3.4–10.8)

## 2023-09-12 ENCOUNTER — Other Ambulatory Visit (INDEPENDENT_AMBULATORY_CARE_PROVIDER_SITE_OTHER): Payer: Self-pay | Admitting: Primary Care

## 2023-09-12 ENCOUNTER — Encounter (INDEPENDENT_AMBULATORY_CARE_PROVIDER_SITE_OTHER): Payer: Self-pay | Admitting: Primary Care

## 2023-09-12 MED ORDER — GEMFIBROZIL 600 MG PO TABS: 600.0000 mg | ORAL_TABLET | Freq: Two times a day (BID) | ORAL | 1 refills | Status: AC

## 2023-11-07 DIAGNOSIS — E785 Hyperlipidemia, unspecified: Secondary | ICD-10-CM | POA: Diagnosis not present

## 2023-11-07 DIAGNOSIS — Z0000 Encounter for general adult medical examination without abnormal findings: Secondary | ICD-10-CM | POA: Diagnosis not present

## 2023-11-07 DIAGNOSIS — M2550 Pain in unspecified joint: Secondary | ICD-10-CM | POA: Diagnosis not present

## 2023-11-07 DIAGNOSIS — E781 Pure hyperglyceridemia: Secondary | ICD-10-CM | POA: Diagnosis not present

## 2023-11-07 DIAGNOSIS — Z1159 Encounter for screening for other viral diseases: Secondary | ICD-10-CM | POA: Diagnosis not present

## 2023-11-07 DIAGNOSIS — I517 Cardiomegaly: Secondary | ICD-10-CM | POA: Diagnosis not present

## 2023-11-07 DIAGNOSIS — I10 Essential (primary) hypertension: Secondary | ICD-10-CM | POA: Diagnosis not present

## 2023-11-14 ENCOUNTER — Telehealth (INDEPENDENT_AMBULATORY_CARE_PROVIDER_SITE_OTHER): Payer: Self-pay | Admitting: Primary Care

## 2023-11-14 NOTE — Telephone Encounter (Signed)
 Called to confirm appt. Pt will be present

## 2023-11-20 ENCOUNTER — Ambulatory Visit (INDEPENDENT_AMBULATORY_CARE_PROVIDER_SITE_OTHER): Payer: Medicare Other | Admitting: Primary Care

## 2023-11-20 ENCOUNTER — Encounter (INDEPENDENT_AMBULATORY_CARE_PROVIDER_SITE_OTHER): Payer: Self-pay | Admitting: Primary Care

## 2023-11-20 VITALS — BP 121/63 | HR 71 | Resp 16 | Wt 156.6 lb

## 2023-11-20 DIAGNOSIS — E782 Mixed hyperlipidemia: Secondary | ICD-10-CM | POA: Diagnosis not present

## 2023-11-20 DIAGNOSIS — R748 Abnormal levels of other serum enzymes: Secondary | ICD-10-CM

## 2023-11-20 DIAGNOSIS — I10 Essential (primary) hypertension: Secondary | ICD-10-CM | POA: Diagnosis not present
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
## 2023-11-28 ENCOUNTER — Ambulatory Visit (INDEPENDENT_AMBULATORY_CARE_PROVIDER_SITE_OTHER): Admitting: Primary Care

## 2023-12-27 ENCOUNTER — Other Ambulatory Visit (INDEPENDENT_AMBULATORY_CARE_PROVIDER_SITE_OTHER)

## 2024-01-03 ENCOUNTER — Other Ambulatory Visit (INDEPENDENT_AMBULATORY_CARE_PROVIDER_SITE_OTHER)

## 2024-01-03 DIAGNOSIS — I10 Essential (primary) hypertension: Secondary | ICD-10-CM

## 2024-01-03 DIAGNOSIS — R748 Abnormal levels of other serum enzymes: Secondary | ICD-10-CM

## 2024-01-03 DIAGNOSIS — E782 Mixed hyperlipidemia: Secondary | ICD-10-CM

## 2024-01-04 ENCOUNTER — Ambulatory Visit (INDEPENDENT_AMBULATORY_CARE_PROVIDER_SITE_OTHER): Payer: Self-pay | Admitting: Primary Care

## 2024-01-04 LAB — CMP14+EGFR
ALT: 9 IU/L (ref 0–44)
AST: 22 IU/L (ref 0–40)
Albumin: 4.6 g/dL (ref 3.8–4.9)
Alkaline Phosphatase: 73 IU/L (ref 44–121)
BUN/Creatinine Ratio: 14 (ref 9–20)
BUN: 11 mg/dL (ref 6–24)
Bilirubin Total: 0.7 mg/dL (ref 0.0–1.2)
CO2: 23 mmol/L (ref 20–29)
Calcium: 9.5 mg/dL (ref 8.7–10.2)
Chloride: 96 mmol/L (ref 96–106)
Creatinine, Ser: 0.79 mg/dL (ref 0.76–1.27)
Globulin, Total: 2.5 g/dL (ref 1.5–4.5)
Glucose: 78 mg/dL (ref 70–99)
Potassium: 4.2 mmol/L (ref 3.5–5.2)
Sodium: 138 mmol/L (ref 134–144)
Total Protein: 7.1 g/dL (ref 6.0–8.5)
eGFR: 104 mL/min/{1.73_m2} (ref >59)

## 2024-01-04 LAB — LIPID PANEL
Chol/HDL Ratio: 2.1 ratio (ref 0.0–5.0)
Cholesterol, Total: 164 mg/dL (ref 100–199)
HDL: 80 mg/dL (ref >39)
LDL Chol Calc (NIH): 72 mg/dL (ref 0–99)
Triglycerides: 61 mg/dL (ref 0–149)
VLDL Cholesterol Cal: 12 mg/dL (ref 5–40)

## 2024-01-14 ENCOUNTER — Encounter (INDEPENDENT_AMBULATORY_CARE_PROVIDER_SITE_OTHER): Payer: 59

## 2024-01-18 ENCOUNTER — Telehealth (INDEPENDENT_AMBULATORY_CARE_PROVIDER_SITE_OTHER): Payer: Self-pay

## 2024-01-18 ENCOUNTER — Encounter (INDEPENDENT_AMBULATORY_CARE_PROVIDER_SITE_OTHER)

## 2024-01-18 NOTE — Telephone Encounter (Signed)
Contacted pt to start Medicare Wellness pt didn't answer lvm

## 2024-03-25 ENCOUNTER — Ambulatory Visit (INDEPENDENT_AMBULATORY_CARE_PROVIDER_SITE_OTHER): Admitting: Radiology

## 2024-03-25 ENCOUNTER — Ambulatory Visit
Admission: EM | Admit: 2024-03-25 | Discharge: 2024-03-25 | Disposition: A | Discharge status: Home / Self Care | Attending: Physician Assistant | Admitting: Physician Assistant

## 2024-03-25 ENCOUNTER — Encounter: Payer: Self-pay | Admitting: Emergency Medicine

## 2024-03-25 DIAGNOSIS — M25532 Pain in left wrist: Secondary | ICD-10-CM | POA: Diagnosis not present

## 2024-03-25 DIAGNOSIS — M19042 Primary osteoarthritis, left hand: Secondary | ICD-10-CM | POA: Diagnosis not present

## 2024-03-25 DIAGNOSIS — S63502A Unspecified sprain of left wrist, initial encounter: Secondary | ICD-10-CM

## 2024-03-25 DIAGNOSIS — M19032 Primary osteoarthritis, left wrist: Secondary | ICD-10-CM | POA: Diagnosis not present

## 2024-03-25 DIAGNOSIS — M79642 Pain in left hand: Secondary | ICD-10-CM | POA: Diagnosis not present

## 2024-03-25 MED ORDER — HYDROCODONE-ACETAMINOPHEN 5-325 MG PO TABS: 1.0000 | ORAL_TABLET | Freq: Four times a day (QID) | ORAL | 0 refills | Status: AC | PRN

## 2024-03-25 NOTE — Discharge Instructions (Addendum)
 Elevate wrist and hand,  Ice to area of swelling.  Pain medication as needed.  Ibuprofen  as well for pain and swelling
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
## 2024-03-25 NOTE — ED Triage Notes (Signed)
 Pt presents with left wrist pain, and swelling that happened Sunday after he had syncopal episode at work.  Pt unsure if he hit head. Pt states he was drinking.

## 2024-04-23 ENCOUNTER — Telehealth (INDEPENDENT_AMBULATORY_CARE_PROVIDER_SITE_OTHER): Payer: Self-pay | Admitting: *Deleted

## 2024-04-23 NOTE — Telephone Encounter (Signed)
 Called patient to get more information about a pain referral. Question this writer asked was if the referral was placed at last OV in April?   He stated,  I don't have time to explain all of it and continued to say that if it wasn't done don't worry about it because I don't have time to jump through the hoops. They tell me I can't smoke marijuana but that is how my pain is relieved and I am not going to stop smoking. Continue with the rhetoric and all the other stupid stuff y'all are on. I don't need nothing from yall. Then he hung up the phone.   Writer will not call patient back due to patient being upset and not willing to explain when the call was placed.

## 2024-07-15 ENCOUNTER — Ambulatory Visit

## 2024-10-02 ENCOUNTER — Encounter (INDEPENDENT_AMBULATORY_CARE_PROVIDER_SITE_OTHER): Payer: Self-pay | Admitting: Primary Care

## 2024-11-11 ENCOUNTER — Ambulatory Visit: Payer: Self-pay
